# Patient Record
Sex: Male | Born: 1945 | Race: White | Hispanic: No | Marital: Married | Smoking: Former smoker
Health system: Southern US, Community
[De-identification: ages and names within clinical notes are randomized; demographics above are authoritative.]

## PROBLEM LIST (undated history)

## (undated) DIAGNOSIS — F32A Depression, unspecified: Secondary | ICD-10-CM

## (undated) DIAGNOSIS — F419 Anxiety disorder, unspecified: Secondary | ICD-10-CM

## (undated) DIAGNOSIS — I10 Essential (primary) hypertension: Secondary | ICD-10-CM

## (undated) HISTORY — DX: Essential (primary) hypertension: I10

## (undated) HISTORY — PX: HAND SURGERY: SHX662

---

## 2009-02-09 ENCOUNTER — Emergency Department (HOSPITAL_COMMUNITY): Admission: EM | Admit: 2009-02-09 | Discharge: 2009-02-09 | Payer: Self-pay | Admitting: *Deleted

## 2014-01-17 DIAGNOSIS — C4491 Basal cell carcinoma of skin, unspecified: Secondary | ICD-10-CM

## 2014-01-17 HISTORY — DX: Basal cell carcinoma of skin, unspecified: C44.91

## 2014-01-21 ENCOUNTER — Institutional Professional Consult (permissible substitution): Payer: Self-pay | Admitting: Pulmonary Disease

## 2014-02-04 ENCOUNTER — Institutional Professional Consult (permissible substitution): Payer: Self-pay | Admitting: Pulmonary Disease

## 2014-02-11 ENCOUNTER — Institutional Professional Consult (permissible substitution): Payer: Self-pay | Admitting: Internal Medicine

## 2015-07-07 ENCOUNTER — Other Ambulatory Visit: Payer: Self-pay | Admitting: Internal Medicine

## 2015-07-07 ENCOUNTER — Ambulatory Visit
Admission: RE | Admit: 2015-07-07 | Discharge: 2015-07-07 | Disposition: A | Discharge status: Home / Self Care | Payer: Medicare HMO | Source: Ambulatory Visit | Attending: Internal Medicine | Admitting: Internal Medicine

## 2015-07-07 DIAGNOSIS — R059 Cough, unspecified: Secondary | ICD-10-CM

## 2015-07-07 DIAGNOSIS — R05 Cough: Secondary | ICD-10-CM

## 2015-09-05 ENCOUNTER — Other Ambulatory Visit: Payer: Self-pay | Admitting: Internal Medicine

## 2015-09-05 DIAGNOSIS — R140 Abdominal distension (gaseous): Secondary | ICD-10-CM

## 2015-09-07 ENCOUNTER — Ambulatory Visit
Admission: RE | Admit: 2015-09-07 | Discharge: 2015-09-07 | Disposition: A | Discharge status: Home / Self Care | Payer: Medicare HMO | Source: Ambulatory Visit | Attending: Internal Medicine | Admitting: Internal Medicine

## 2015-09-07 DIAGNOSIS — R140 Abdominal distension (gaseous): Secondary | ICD-10-CM

## 2016-08-20 DIAGNOSIS — I10 Essential (primary) hypertension: Secondary | ICD-10-CM | POA: Insufficient documentation

## 2018-03-02 DIAGNOSIS — M720 Palmar fascial fibromatosis [Dupuytren]: Secondary | ICD-10-CM | POA: Insufficient documentation

## 2020-01-14 ENCOUNTER — Ambulatory Visit: Payer: Medicare HMO | Attending: Internal Medicine

## 2020-01-14 DIAGNOSIS — Z23 Encounter for immunization: Secondary | ICD-10-CM | POA: Insufficient documentation

## 2020-01-14 NOTE — Progress Notes (Signed)
   Covid-19 Vaccination Clinic  Name:  Eduardo Hall    MRN: [id number] DOB: [birth year]  01/14/2020  Eduardo Hall was observed post Covid-19 immunization for 15 minutes without incidence. He was provided with Vaccine Information Sheet and instruction to access the V-Safe system.   Eduardo Hall was instructed to call 911 with any severe reactions post vaccine: Marland Kitchen Difficulty breathing  . Swelling of your face and throat  . A fast heartbeat  . A bad rash all over your body  . Dizziness and weakness    Immunizations Administered    Name Date Dose VIS Date Route   Pfizer COVID-19 Vaccine 01/14/2020 12:17 PM 0.3 mL 11/19/2019 Intramuscular   Manufacturer: [place]   Lot: [id number]   [place]: [id number]

## 2020-01-31 ENCOUNTER — Ambulatory Visit: Payer: Medicare HMO

## 2020-02-05 ENCOUNTER — Ambulatory Visit: Payer: Medicare HMO | Attending: Internal Medicine

## 2020-02-05 DIAGNOSIS — Z23 Encounter for immunization: Secondary | ICD-10-CM | POA: Insufficient documentation

## 2020-02-08 ENCOUNTER — Ambulatory Visit: Payer: Medicare HMO

## 2020-02-25 ENCOUNTER — Encounter: Payer: Self-pay | Admitting: Physician Assistant

## 2020-02-25 ENCOUNTER — Other Ambulatory Visit: Payer: Self-pay

## 2020-02-25 ENCOUNTER — Ambulatory Visit: Payer: Medicare HMO | Admitting: Physician Assistant

## 2020-02-25 DIAGNOSIS — L219 Seborrheic dermatitis, unspecified: Secondary | ICD-10-CM | POA: Diagnosis not present

## 2020-02-25 DIAGNOSIS — D0461 Carcinoma in situ of skin of right upper limb, including shoulder: Secondary | ICD-10-CM

## 2020-02-25 DIAGNOSIS — L299 Pruritus, unspecified: Secondary | ICD-10-CM | POA: Diagnosis not present

## 2020-02-25 DIAGNOSIS — L570 Actinic keratosis: Secondary | ICD-10-CM

## 2020-02-25 DIAGNOSIS — C4492 Squamous cell carcinoma of skin, unspecified: Secondary | ICD-10-CM

## 2020-02-25 DIAGNOSIS — D492 Neoplasm of unspecified behavior of bone, soft tissue, and skin: Secondary | ICD-10-CM

## 2020-02-25 DIAGNOSIS — Z85828 Personal history of other malignant neoplasm of skin: Secondary | ICD-10-CM | POA: Diagnosis not present

## 2020-02-25 HISTORY — DX: Squamous cell carcinoma of skin, unspecified: C44.92

## 2020-02-25 NOTE — Patient Instructions (Signed)

## 2020-02-25 NOTE — Progress Notes (Signed)
   Follow up Visit  Subjective  Eduardo Hall is a 74 y.o. male who presents for the following: Follow-up (Patient, last seen in October 2020, is here to recheck his right anterior scalp and right bulb of nose.  Patient was supposed to have had MOHs done on 01/13/2020 with Dr. Winifred Olive. He mentioned that Dr. Winifred Olive decided to use 5FU for 3 weeks, instead of doing the [facility] procedure.  Has follow-up appointment with him in April.) and Dermatitis (He mentioned that he is still itching from waist up.  Stated that he has been using the Ciclopirox shampoo/cream for his seborrheic dermatitis and TAC and hydrocortisone for the general itching. Is having flaking on his face but it has improved.  Also, he has added an allergy tablet and Benadryl daily for the general itching.  Lab work done was negative.).  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 They are using all free and clear detergent and he is using Dove soap. He itches especially over the chest and there is never any rash there.   Objective  Well appearing patient in no apparent distress; mood and affect are within normal limits.  All skin waist up examined. No suspicious moles noted on back. No rash noted waist up other than the seb derm in his eyebrow and that has improved.    Right Shoulder - Posterior      Assessment & Plan  Actinic keratosis Left Temple  Destruction of lesion - Left Temple Complexity: simple   Destruction method: cryotherapy   Informed consent: discussed and consent obtained   Timeout:  patient name, date of birth, surgical site, and procedure verified Lesion destroyed using liquid nitrogen: Yes   Outcome: patient tolerated procedure well with no complications    Neoplasm of skin Right Shoulder - Posterior  Skin / nail biopsy Type of biopsy: tangential   Informed consent: discussed and consent obtained     Instrument used: flexible razor blade   Hemostasis achieved with: ferric subsulfate   Outcome: patient tolerated procedure well   Post-procedure details: wound care instructions given    Specimen 1 - Surgical pathology Differential Diagnosis: BCC vs SCC  Check Margins: No  Pruritis waist up We will request his most recent labs to view those and I will discuss case with Dr. Denna Haggard to see what our other options may be.

## 2020-02-25 NOTE — Progress Notes (Deleted)
   Follow-Up Visit   Subjective  Eduardo Hall is a 74 y.o. male who presents for the following: Follow-up (Patient, last seen in October 2020, is here to recheck his right anterior scalp and right bulb of nose.  Patient had MOHs done on 01/13/2020 with Dr. Winifred Olive. He mentioned that Dr. Winifred Olive decided to use 5FU for 3 weeks, instead of doing the [facility] procedure.  Has follow-up appointment with him in April.) and Dermatitis (He mentioned that he is still itching from waist up.  Stated that he has been using the Ciclopirox shampoo/cream, TAC and hydrocortisone.  Is having flaking on his face.  Also, he has added an allergy tablet and Benadryl daily.  Lab work done was negative.). Dermatitis He complains of a rash. Symptoms began {0-10:33138} {time units:11} ago. Patient describes the rash as {dermatitis:5764}. Characteristics of rash and associated history: {dermrash:16702}. His previous dermatologic history includes {derm diag:16511}. Family history of derm problems: {derm diag:16511}. Medications currently using: {derm meds:16509}. Environmental exposures or allergies: {allergens:16439}.    The following portions of the chart were reviewed this encounter and updated as appropriate:     {Review of Systems:34166::"No other skin or systemic complaints."}  Objective  Well appearing patient in no apparent distress; mood and affect are within normal limits.  [id number] full examination was performed including scalp, head, eyes, ears, nose, lips, neck, chest, axillae, abdomen, back, buttocks, bilateral upper extremities, bilateral lower extremities, hands, feet, fingers, toes, fingernails, and toenails. All findings within normal limits unless otherwise noted below."}  No skin findings found.  Assessment & Plan   No follow-ups on file.

## 2020-02-28 ENCOUNTER — Encounter: Payer: Self-pay | Admitting: Physician Assistant

## 2020-02-29 ENCOUNTER — Telehealth: Payer: Self-pay

## 2020-02-29 NOTE — Telephone Encounter (Signed)
-----   Message from Arlyss Gandy, [place] sent at 02/29/2020  9:03 AM EDT ----- 30 minute surgery with JCB

## 2020-02-29 NOTE — Telephone Encounter (Signed)
Left voice mail for patient to call.

## 2020-02-29 NOTE — Telephone Encounter (Signed)
Path to patient and 30 minute schedule with JCB on Apr 10, 2020.

## 2020-03-20 ENCOUNTER — Ambulatory Visit: Payer: Medicare HMO | Admitting: Physician Assistant

## 2020-04-10 ENCOUNTER — Encounter: Payer: Self-pay | Admitting: Physician Assistant

## 2020-04-10 ENCOUNTER — Ambulatory Visit (INDEPENDENT_AMBULATORY_CARE_PROVIDER_SITE_OTHER): Payer: Medicare HMO | Admitting: Physician Assistant

## 2020-04-10 ENCOUNTER — Other Ambulatory Visit: Payer: Self-pay

## 2020-04-10 DIAGNOSIS — D0461 Carcinoma in situ of skin of right upper limb, including shoulder: Secondary | ICD-10-CM

## 2020-04-10 DIAGNOSIS — D485 Neoplasm of uncertain behavior of skin: Secondary | ICD-10-CM

## 2020-04-10 DIAGNOSIS — Z85828 Personal history of other malignant neoplasm of skin: Secondary | ICD-10-CM | POA: Diagnosis not present

## 2020-04-10 DIAGNOSIS — L299 Pruritus, unspecified: Secondary | ICD-10-CM | POA: Diagnosis not present

## 2020-04-10 NOTE — Progress Notes (Addendum)
   Follow up Visit  Subjective  Eduardo Hall is a 74 y.o. 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 He also has a bump near his nose on the right side that gets irritated. He is also still having trouble with the itch on his upper body and neck.  Objective  Well appearing patient in no apparent distress; mood and affect are within normal limits.  All skin waist up examined not including scalp. No suspicious moles noted on back.   Objective  Right Shoulder - Posterior: Biopsy scar identified.   Objective  Mid Tip of Nose: Surgical scar examined no sign of recurrence. Was seen by [facility].  Objective  Right Nasofacial Sulcus: Pearly papule right nasal crease.     Objective  Neck - Anterior: Pruritis on upper body and neck.  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 Right Shoulder - Posterior  Destruction of lesion Complexity: simple   Destruction method: electrodesiccation and curettage   Informed consent: discussed and consent obtained   Timeout:  patient name, date of birth, surgical site, and procedure verified Anesthesia: the lesion was anesthetized in a standard fashion   Anesthetic:  1% lidocaine w/ epinephrine 1-100,000 local infiltration Curettage performed in three different directions: Yes   Curettage cycles:  3 Lesion length (cm):  2 Lesion width (cm):  1 Margin per side (cm):  0 Final wound size (cm):  2 Hemostasis achieved with:  ferric subsulfate Outcome: patient tolerated procedure well with no complications   Post-procedure details: sterile dressing applied and wound care instructions given   Dressing type: bandage and petrolatum   Additional details:  Wound innoculated with 5 fluorouracil solution. 2.0cm  History of basal cell carcinoma (BCC) Mid Tip of Nose  Neoplasm of uncertain behavior of skin Right Nasofacial Sulcus  Skin / nail biopsy Type of  biopsy: tangential   Informed consent: discussed and consent obtained   Procedure prep:  Patient was prepped and draped in usual sterile fashion (Non sterile) Prep type:  Chlorhexidine Anesthesia: the lesion was anesthetized in a standard fashion   Anesthetic:  1% lidocaine w/ epinephrine 1-100,000 local infiltration Instrument used: flexible razor blade   Hemostasis achieved with: ferric subsulfate   Outcome: patient tolerated procedure well   Post-procedure details: sterile dressing applied and wound care instructions given   Dressing type: bandage and petrolatum   Additional details:  Cautery only  Specimen 1 - Surgical pathology Differential Diagnosis: BCC Check Margins: No  Pruritus Neck - Anterior  Other Related Procedures Bile acids, total

## 2020-04-10 NOTE — Patient Instructions (Signed)

## 2020-04-15 LAB — BILE ACIDS, TOTAL: Bile Acids Total: 10 umol/L (ref 0–19)

## 2020-04-17 ENCOUNTER — Telehealth: Payer: Self-pay | Admitting: *Deleted

## 2020-04-17 NOTE — Telephone Encounter (Signed)
Path results and lab results to patient. Per Arlyss Gandy he can see Dr. Darene Lamer for another opinion to see if he can help, appointment made.

## 2020-04-17 NOTE — Telephone Encounter (Signed)
-----   Message from Arlyss Gandy, [place] sent at 04/17/2020  4:34 PM EDT ----- Bile acids are normal. Nothing in labs to account for pruritis. May be good for him to see ST for appt to see if he can help.

## 2020-05-17 ENCOUNTER — Ambulatory Visit: Payer: Medicare HMO | Admitting: Dermatology

## 2020-06-26 ENCOUNTER — Ambulatory Visit: Payer: Medicare HMO | Admitting: Dermatology

## 2020-06-26 ENCOUNTER — Other Ambulatory Visit: Payer: Self-pay

## 2020-06-26 DIAGNOSIS — L299 Pruritus, unspecified: Secondary | ICD-10-CM

## 2020-06-26 MED ORDER — HALOBETASOL PROPIONATE 0.05 % EX CREA: TOPICAL_CREAM | Freq: Two times a day (BID) | CUTANEOUS | 3 refills | Status: AC

## 2020-07-01 ENCOUNTER — Encounter: Payer: Self-pay | Admitting: Dermatology

## 2020-07-01 NOTE — Progress Notes (Signed)
° °  Follow-Up Visit   Subjective  Eduardo Hall is a 74 y.o. male who presents for the following: Pruritis (wants second opinion, patient has been itching for the last 2 years on his chest, last month its gotten better, also experiencing spots on neck, has had to wear gloves to bed to keep from scratching).  Itch, rash Location: Mostly torso Duration:  Quality:  Associated Signs/Symptoms: Modifying Factors:  Severity:  Timing: Context: Review of systems negative for systemic causes of itching.  Objective  Well appearing patient in no apparent distress; mood and affect are within normal limits.  All skin waist up examined.   Assessment & Plan    Pruritus (2) Chest - [facility]); Mid Back  30-day trial topical halobetasol to all areas prone to rash once daily after bathing.  Follow-up by MyChart or by telephone at that time  Ordered Medications: halobetasol (ULTRAVATE) 0.05 % cream     I, Lavonna Monarch, MD, have reviewed all documentation for this visit.  The documentation on 07/01/20 for the exam, diagnosis, procedures, and orders are all accurate and complete.

## 2020-07-10 ENCOUNTER — Ambulatory Visit: Payer: Medicare HMO | Admitting: Physician Assistant

## 2020-07-10 ENCOUNTER — Other Ambulatory Visit: Payer: Self-pay

## 2020-07-10 ENCOUNTER — Encounter: Payer: Self-pay | Admitting: Physician Assistant

## 2020-07-10 DIAGNOSIS — L219 Seborrheic dermatitis, unspecified: Secondary | ICD-10-CM

## 2020-07-10 DIAGNOSIS — Z86007 Personal history of in-situ neoplasm of skin: Secondary | ICD-10-CM | POA: Diagnosis not present

## 2020-07-10 DIAGNOSIS — D485 Neoplasm of uncertain behavior of skin: Secondary | ICD-10-CM | POA: Diagnosis not present

## 2020-07-10 MED ORDER — HYDROCORTISONE 2.5 % EX CREA: TOPICAL_CREAM | Freq: Two times a day (BID) | CUTANEOUS | 2 refills | Status: AC | PRN

## 2020-07-10 NOTE — Progress Notes (Signed)
   Follow up Visit  Subjective  Eduardo Hall is a 74 y.o. male who presents for the following: Squamous Cell Carcinoma (follow up from biopsy). He feels it is doing well. His itching is coming and going and he did not get the halobetasol because it was too expensive. He would like a refill on his hydrocortisone 2.5% for his seborrheic dermatitis.   Objective  Well appearing patient in no apparent distress; mood and affect are within normal limits.  A focused examination was performed including right ear, right shoulder, arms, face. Relevant physical exam findings are noted in the Assessment and Plan.   Objective  Right Shoulder - Posterior: Dyspigmented scar.   Objective  Left Ala Nasi, Right Alar Crease: Erythematous plaques with greasy scale.   Objective  Right Superior Helix: Crusted papule     Assessment & Plan  Personal history of in-situ neoplasm of skin Right Shoulder - Posterior  Seborrheic dermatitis (2) Right Alar Crease; Left Ala Nasi  Ordered Medications: hydrocortisone 2.5 % cream  Neoplasm of uncertain behavior of skin Right Superior Helix  Skin / nail biopsy Type of biopsy: tangential   Informed consent: discussed and consent obtained   Timeout: patient name, date of birth, surgical site, and procedure verified   Procedure prep:  Patient was prepped and draped in usual sterile fashion (Non sterile) Prep type:  Chlorhexidine Anesthesia: the lesion was anesthetized in a standard fashion   Anesthetic:  1% lidocaine w/ epinephrine 1-100,000 local infiltration Instrument used: flexible razor blade   Outcome: patient tolerated procedure well   Post-procedure details: wound care instructions given    Specimen 1 - Surgical pathology Differential Diagnosis: SCC vs BCC Check Margins: No

## 2020-07-12 ENCOUNTER — Ambulatory Visit
Admission: RE | Admit: 2020-07-12 | Discharge: 2020-07-12 | Disposition: A | Discharge status: Home / Self Care | Payer: Medicare HMO | Source: Ambulatory Visit | Attending: Internal Medicine | Admitting: Internal Medicine

## 2020-07-12 ENCOUNTER — Other Ambulatory Visit: Payer: Self-pay | Admitting: Internal Medicine

## 2020-07-12 DIAGNOSIS — R0602 Shortness of breath: Secondary | ICD-10-CM

## 2020-07-13 ENCOUNTER — Telehealth: Payer: Self-pay

## 2020-07-13 NOTE — Telephone Encounter (Signed)
-----   Message from Arlyss Gandy, [place] sent at 07/12/2020  9:45 PM EDT ----- Recheck in 2 months

## 2020-07-13 NOTE — Telephone Encounter (Signed)
Patient aware.

## 2020-08-07 ENCOUNTER — Ambulatory Visit: Payer: Medicare HMO | Admitting: Dermatology

## 2020-08-15 ENCOUNTER — Ambulatory Visit (INDEPENDENT_AMBULATORY_CARE_PROVIDER_SITE_OTHER): Payer: Medicare HMO | Admitting: Otolaryngology

## 2020-08-15 ENCOUNTER — Encounter (INDEPENDENT_AMBULATORY_CARE_PROVIDER_SITE_OTHER): Payer: Self-pay | Admitting: Otolaryngology

## 2020-08-15 ENCOUNTER — Other Ambulatory Visit: Payer: Self-pay

## 2020-08-15 VITALS — Temp 97.5°F

## 2020-08-15 DIAGNOSIS — H903 Sensorineural hearing loss, bilateral: Secondary | ICD-10-CM | POA: Diagnosis not present

## 2020-08-15 DIAGNOSIS — H9313 Tinnitus, bilateral: Secondary | ICD-10-CM | POA: Diagnosis not present

## 2020-08-15 NOTE — Progress Notes (Signed)
HPI: Eduardo Hall is a 74 y.o. male who presents for evaluation of a humming or ringing in his ears for the past 3 weeks.  He has had a previous history of loud noise exposure.  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  He first noticed the ringing in the ears when he was changed on his prescription blood pressure medication.  Denies any ear pain.  Denies any recent loud noise exposure.  Past Medical History:  Diagnosis Date   Basal cell carcinoma 01/17/2014   Right arm - excision with Dr. Mancel Bale   Basal cell carcinoma 10/17/2015   Right cheek - excision with Dr. Mancel Bale   Basal cell carcinoma 10/08/2019   Right bulb of nose - Dr. 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 Social History   Socioeconomic History   Marital status: Married    Spouse name: Not on file   Number of children: Not on file   Years of education: Not on file   Highest education level: Not on file  Occupational History   Not on file  Tobacco Use   Smoking status: Former Smoker   Smokeless tobacco: Never Used  Scientific laboratory technician Use: Never assessed  Substance and Sexual Activity   Alcohol use: Yes   Drug use: Never   Sexual activity: Not on file  Other Topics Concern   Not on file  Social History Narrative   Not on file   Social Determinants of Health   Financial Resource Strain:    Difficulty of Paying Living Expenses: Not on file  Food Insecurity:    Worried About [place] in the Last Year: Not on file   Ran Out of Food in the Last Year: Not on file  Transportation Needs:    Lack of Transportation (Medical): Not on file   Lack of Transportation (Non-Medical): Not on file  Physical Activity:    Days of Exercise per Week: Not on file   Minutes of Exercise per Session:  Not on file  Stress:    Feeling of Stress : Not on file  Social Connections:    Frequency of Communication with Friends and Family: Not on file   Frequency of Social Gatherings with Friends and Family: Not on file   Attends Religious Services: Not on file   Active Member of Clubs or Organizations: Not on file   Attends Archivist Meetings: Not on file   Marital Status: Not on file   No family history on file. No Known Allergies Prior to Admission medications   Medication Sig Start Date End Date Taking? Authorizing Provider  ALPRAZolam Duanne Moron) 1 MG tablet Take 1 mg by mouth at bedtime as needed. 03/30/20  Yes [provider]  amLODipine (NORVASC) 10 MG tablet Take 10 mg by mouth daily.   Yes [provider]  ciclopirox (LOPROX) 0.77 % cream Apply topically 2 (two) times daily.   Yes [provider]  Ciclopirox 1 % shampoo Apply 120 mLs topically at bedtime.   Yes [provider]  fluorouracil (EFUDEX) 5 % cream Apply 40 g topically at bedtime. Apply QHS for one week, take 3 weeks off and repeat.  Avoid sun and expect irritation.   Yes Clark-Bruning, Jennifer, PA-C  halobetasol (ULTRAVATE) 0.05 %  cream Apply topically 2 (two) times daily. 06/26/20  Yes Lavonna Monarch, MD  hydrochlorothiazide (HYDRODIURIL) 12.5 MG tablet Take by mouth.   Yes [provider]  hydrocortisone 2.5 % cream Apply 1 application topically 2 (two) times daily. 10/02/18  Yes [provider]  hydrocortisone 2.5 % cream Apply topically 2 (two) times daily as needed (Rash). 07/10/20  Yes Clark-Bruning, Anderson [place], PA-C  lisinopril (ZESTRIL) 5 MG tablet Take 5 mg by mouth daily.   Yes [provider]  sildenafil (VIAGRA) 100 MG tablet Viagra 100 mg tablet   Yes [provider]  simvastatin (ZOCOR) 40 MG tablet Take 40 mg by mouth daily.   Yes [provider]  tetrahydrozoline 0.05 % ophthalmic solution eye drops redness reliever    Yes [provider]  triamcinolone ointment (KENALOG) 0.1 % Apply 1 application topically 2 (two) times daily.   Yes [provider]     Positive ROS: Otherwise negative  All other systems have been reviewed and were otherwise negative with the exception of those mentioned in the HPI and as above.  Physical Exam: Constitutional: Alert, well-appearing, no acute distress Ears: External ears without lesions or tenderness. Ear canals are clear bilaterally with intact, clear TMs.  Auscultation of the ears revealed no objective tinnitus or sounds. Nasal: External nose without lesions. Septum with mild deformity and mild rhinitis.  But no signs of infection.. Clear nasal passages Oral: Lips and gums without lesions. Tongue and palate mucosa without lesions. Posterior oropharynx clear. Neck: No palpable adenopathy or masses.  Auscultation of the neck reveals no carotid bruits. Respiratory: Breathing comfortably  Skin: No facial/neck lesions or rash noted.  Audiogram in the office today demonstrated a mild bilateral symmetric sensorineural hearing loss with severe downsloping SNHL in both ears above 4000 frequency.  He had type A tympanograms bilaterally.  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  Plan: Reviewed with him concerning treatment options which are limited.  Discussed with him concerning using masking noise to help cope with the tinnitus.  He would also be a candidate for hearing aids. Gave him samples of Lipo flavonoid to try as this is beneficial in some people. Also recommended using ear protection when around loud noise. He will follow-up as needed.  Radene Journey, MD

## 2020-08-17 ENCOUNTER — Encounter (INDEPENDENT_AMBULATORY_CARE_PROVIDER_SITE_OTHER): Payer: Self-pay

## 2020-09-06 ENCOUNTER — Other Ambulatory Visit: Payer: Self-pay | Admitting: Internal Medicine

## 2020-09-06 DIAGNOSIS — R42 Dizziness and giddiness: Secondary | ICD-10-CM

## 2020-09-06 DIAGNOSIS — R519 Headache, unspecified: Secondary | ICD-10-CM

## 2020-09-06 DIAGNOSIS — H9319 Tinnitus, unspecified ear: Secondary | ICD-10-CM

## 2020-09-18 ENCOUNTER — Ambulatory Visit
Admission: RE | Admit: 2020-09-18 | Discharge: 2020-09-18 | Disposition: A | Discharge status: Home / Self Care | Payer: Medicare HMO | Source: Ambulatory Visit | Attending: Internal Medicine | Admitting: Internal Medicine

## 2020-09-18 DIAGNOSIS — H9319 Tinnitus, unspecified ear: Secondary | ICD-10-CM

## 2020-09-18 DIAGNOSIS — R42 Dizziness and giddiness: Secondary | ICD-10-CM

## 2020-09-18 DIAGNOSIS — R519 Headache, unspecified: Secondary | ICD-10-CM

## 2021-01-16 DIAGNOSIS — I10 Essential (primary) hypertension: Secondary | ICD-10-CM | POA: Diagnosis not present

## 2021-02-13 ENCOUNTER — Other Ambulatory Visit: Payer: Self-pay | Admitting: Specialist

## 2021-02-13 DIAGNOSIS — I10 Essential (primary) hypertension: Secondary | ICD-10-CM | POA: Diagnosis not present

## 2021-02-13 DIAGNOSIS — G4452 New daily persistent headache (NDPH): Secondary | ICD-10-CM | POA: Diagnosis not present

## 2021-02-13 DIAGNOSIS — Z79899 Other long term (current) drug therapy: Secondary | ICD-10-CM | POA: Diagnosis not present

## 2021-02-13 DIAGNOSIS — Z049 Encounter for examination and observation for unspecified reason: Secondary | ICD-10-CM | POA: Diagnosis not present

## 2021-02-27 DIAGNOSIS — M542 Cervicalgia: Secondary | ICD-10-CM | POA: Diagnosis not present

## 2021-02-27 DIAGNOSIS — G4452 New daily persistent headache (NDPH): Secondary | ICD-10-CM | POA: Diagnosis not present

## 2021-02-27 DIAGNOSIS — M791 Myalgia, unspecified site: Secondary | ICD-10-CM | POA: Diagnosis not present

## 2021-02-27 DIAGNOSIS — G518 Other disorders of facial nerve: Secondary | ICD-10-CM | POA: Diagnosis not present

## 2021-03-03 ENCOUNTER — Ambulatory Visit
Admission: RE | Admit: 2021-03-03 | Discharge: 2021-03-03 | Disposition: A | Discharge status: Home / Self Care | Payer: Medicare HMO | Source: Ambulatory Visit | Attending: Specialist | Admitting: Specialist

## 2021-03-03 ENCOUNTER — Other Ambulatory Visit: Payer: Self-pay

## 2021-03-03 DIAGNOSIS — G4452 New daily persistent headache (NDPH): Secondary | ICD-10-CM

## 2021-03-03 DIAGNOSIS — R519 Headache, unspecified: Secondary | ICD-10-CM | POA: Diagnosis not present

## 2021-03-03 MED ORDER — GADOBENATE DIMEGLUMINE 529 MG/ML IV SOLN
15.0000 mL | Freq: Once | INTRAVENOUS | Status: AC | PRN
  Administered 2021-03-03: 15 mL via INTRAVENOUS

## 2021-03-06 ENCOUNTER — Other Ambulatory Visit: Payer: Medicare HMO

## 2021-03-13 DIAGNOSIS — M542 Cervicalgia: Secondary | ICD-10-CM | POA: Diagnosis not present

## 2021-03-13 DIAGNOSIS — M791 Myalgia, unspecified site: Secondary | ICD-10-CM | POA: Diagnosis not present

## 2021-03-13 DIAGNOSIS — G518 Other disorders of facial nerve: Secondary | ICD-10-CM | POA: Diagnosis not present

## 2021-03-13 DIAGNOSIS — G4452 New daily persistent headache (NDPH): Secondary | ICD-10-CM | POA: Diagnosis not present

## 2021-03-27 DIAGNOSIS — M542 Cervicalgia: Secondary | ICD-10-CM | POA: Diagnosis not present

## 2021-03-27 DIAGNOSIS — G4452 New daily persistent headache (NDPH): Secondary | ICD-10-CM | POA: Diagnosis not present

## 2021-03-27 DIAGNOSIS — M791 Myalgia, unspecified site: Secondary | ICD-10-CM | POA: Diagnosis not present

## 2021-03-27 DIAGNOSIS — G518 Other disorders of facial nerve: Secondary | ICD-10-CM | POA: Diagnosis not present

## 2021-04-11 DIAGNOSIS — M791 Myalgia, unspecified site: Secondary | ICD-10-CM | POA: Diagnosis not present

## 2021-04-11 DIAGNOSIS — G518 Other disorders of facial nerve: Secondary | ICD-10-CM | POA: Diagnosis not present

## 2021-04-11 DIAGNOSIS — G4452 New daily persistent headache (NDPH): Secondary | ICD-10-CM | POA: Diagnosis not present

## 2021-04-11 DIAGNOSIS — M542 Cervicalgia: Secondary | ICD-10-CM | POA: Diagnosis not present

## 2021-04-24 DIAGNOSIS — M542 Cervicalgia: Secondary | ICD-10-CM | POA: Diagnosis not present

## 2021-04-24 DIAGNOSIS — G4452 New daily persistent headache (NDPH): Secondary | ICD-10-CM | POA: Diagnosis not present

## 2021-04-24 DIAGNOSIS — G518 Other disorders of facial nerve: Secondary | ICD-10-CM | POA: Diagnosis not present

## 2021-04-24 DIAGNOSIS — M791 Myalgia, unspecified site: Secondary | ICD-10-CM | POA: Diagnosis not present

## 2021-05-22 DIAGNOSIS — G4452 New daily persistent headache (NDPH): Secondary | ICD-10-CM | POA: Diagnosis not present

## 2021-05-22 DIAGNOSIS — M791 Myalgia, unspecified site: Secondary | ICD-10-CM | POA: Diagnosis not present

## 2021-05-22 DIAGNOSIS — M542 Cervicalgia: Secondary | ICD-10-CM | POA: Diagnosis not present

## 2021-05-22 DIAGNOSIS — G518 Other disorders of facial nerve: Secondary | ICD-10-CM | POA: Diagnosis not present

## 2021-06-06 DIAGNOSIS — I10 Essential (primary) hypertension: Secondary | ICD-10-CM | POA: Diagnosis not present

## 2021-07-03 DIAGNOSIS — M791 Myalgia, unspecified site: Secondary | ICD-10-CM | POA: Diagnosis not present

## 2021-07-03 DIAGNOSIS — M542 Cervicalgia: Secondary | ICD-10-CM | POA: Diagnosis not present

## 2021-07-03 DIAGNOSIS — G518 Other disorders of facial nerve: Secondary | ICD-10-CM | POA: Diagnosis not present

## 2021-07-03 DIAGNOSIS — G4452 New daily persistent headache (NDPH): Secondary | ICD-10-CM | POA: Diagnosis not present

## 2021-07-16 DIAGNOSIS — E785 Hyperlipidemia, unspecified: Secondary | ICD-10-CM | POA: Diagnosis not present

## 2021-07-16 DIAGNOSIS — R1032 Left lower quadrant pain: Secondary | ICD-10-CM | POA: Diagnosis not present

## 2021-07-16 DIAGNOSIS — I70219 Atherosclerosis of native arteries of extremities with intermittent claudication, unspecified extremity: Secondary | ICD-10-CM | POA: Diagnosis not present

## 2021-07-16 DIAGNOSIS — Z1389 Encounter for screening for other disorder: Secondary | ICD-10-CM | POA: Diagnosis not present

## 2021-07-16 DIAGNOSIS — R42 Dizziness and giddiness: Secondary | ICD-10-CM | POA: Diagnosis not present

## 2021-07-16 DIAGNOSIS — H9113 Presbycusis, bilateral: Secondary | ICD-10-CM | POA: Diagnosis not present

## 2021-07-16 DIAGNOSIS — Z1331 Encounter for screening for depression: Secondary | ICD-10-CM | POA: Diagnosis not present

## 2021-07-16 DIAGNOSIS — E039 Hypothyroidism, unspecified: Secondary | ICD-10-CM | POA: Diagnosis not present

## 2021-07-16 DIAGNOSIS — Z125 Encounter for screening for malignant neoplasm of prostate: Secondary | ICD-10-CM | POA: Diagnosis not present

## 2021-07-16 DIAGNOSIS — D485 Neoplasm of uncertain behavior of skin: Secondary | ICD-10-CM | POA: Diagnosis not present

## 2021-07-16 DIAGNOSIS — Z0000 Encounter for general adult medical examination without abnormal findings: Secondary | ICD-10-CM | POA: Diagnosis not present

## 2021-07-16 DIAGNOSIS — R5383 Other fatigue: Secondary | ICD-10-CM | POA: Diagnosis not present

## 2021-07-16 DIAGNOSIS — Z79899 Other long term (current) drug therapy: Secondary | ICD-10-CM | POA: Diagnosis not present

## 2021-07-16 DIAGNOSIS — Z131 Encounter for screening for diabetes mellitus: Secondary | ICD-10-CM | POA: Diagnosis not present

## 2021-07-16 DIAGNOSIS — R002 Palpitations: Secondary | ICD-10-CM | POA: Diagnosis not present

## 2021-07-16 DIAGNOSIS — I10 Essential (primary) hypertension: Secondary | ICD-10-CM | POA: Diagnosis not present

## 2021-07-16 DIAGNOSIS — Z7289 Other problems related to lifestyle: Secondary | ICD-10-CM | POA: Diagnosis not present

## 2021-07-16 DIAGNOSIS — E7800 Pure hypercholesterolemia, unspecified: Secondary | ICD-10-CM | POA: Diagnosis not present

## 2021-07-31 DIAGNOSIS — M542 Cervicalgia: Secondary | ICD-10-CM | POA: Diagnosis not present

## 2021-07-31 DIAGNOSIS — G518 Other disorders of facial nerve: Secondary | ICD-10-CM | POA: Diagnosis not present

## 2021-07-31 DIAGNOSIS — G4452 New daily persistent headache (NDPH): Secondary | ICD-10-CM | POA: Diagnosis not present

## 2021-07-31 DIAGNOSIS — M791 Myalgia, unspecified site: Secondary | ICD-10-CM | POA: Diagnosis not present

## 2021-08-28 DIAGNOSIS — H04123 Dry eye syndrome of bilateral lacrimal glands: Secondary | ICD-10-CM | POA: Diagnosis not present

## 2021-08-28 DIAGNOSIS — H5212 Myopia, left eye: Secondary | ICD-10-CM | POA: Diagnosis not present

## 2021-08-28 DIAGNOSIS — H26493 Other secondary cataract, bilateral: Secondary | ICD-10-CM | POA: Diagnosis not present

## 2021-08-28 DIAGNOSIS — H353132 Nonexudative age-related macular degeneration, bilateral, intermediate dry stage: Secondary | ICD-10-CM | POA: Diagnosis not present

## 2021-09-11 DIAGNOSIS — G518 Other disorders of facial nerve: Secondary | ICD-10-CM | POA: Diagnosis not present

## 2021-09-11 DIAGNOSIS — M542 Cervicalgia: Secondary | ICD-10-CM | POA: Diagnosis not present

## 2021-09-11 DIAGNOSIS — M791 Myalgia, unspecified site: Secondary | ICD-10-CM | POA: Diagnosis not present

## 2021-09-11 DIAGNOSIS — G4452 New daily persistent headache (NDPH): Secondary | ICD-10-CM | POA: Diagnosis not present

## 2021-09-28 DIAGNOSIS — M4726 Other spondylosis with radiculopathy, lumbar region: Secondary | ICD-10-CM | POA: Diagnosis not present

## 2021-09-28 DIAGNOSIS — M9903 Segmental and somatic dysfunction of lumbar region: Secondary | ICD-10-CM | POA: Diagnosis not present

## 2021-09-28 DIAGNOSIS — M5451 Vertebrogenic low back pain: Secondary | ICD-10-CM | POA: Diagnosis not present

## 2021-09-28 DIAGNOSIS — M5440 Lumbago with sciatica, unspecified side: Secondary | ICD-10-CM | POA: Diagnosis not present

## 2021-10-03 DIAGNOSIS — M5440 Lumbago with sciatica, unspecified side: Secondary | ICD-10-CM | POA: Diagnosis not present

## 2021-10-03 DIAGNOSIS — M4726 Other spondylosis with radiculopathy, lumbar region: Secondary | ICD-10-CM | POA: Diagnosis not present

## 2021-10-03 DIAGNOSIS — M5451 Vertebrogenic low back pain: Secondary | ICD-10-CM | POA: Diagnosis not present

## 2021-10-03 DIAGNOSIS — M9903 Segmental and somatic dysfunction of lumbar region: Secondary | ICD-10-CM | POA: Diagnosis not present

## 2021-10-04 DIAGNOSIS — M4726 Other spondylosis with radiculopathy, lumbar region: Secondary | ICD-10-CM | POA: Diagnosis not present

## 2021-10-04 DIAGNOSIS — M5440 Lumbago with sciatica, unspecified side: Secondary | ICD-10-CM | POA: Diagnosis not present

## 2021-10-04 DIAGNOSIS — M9903 Segmental and somatic dysfunction of lumbar region: Secondary | ICD-10-CM | POA: Diagnosis not present

## 2021-10-04 DIAGNOSIS — M5451 Vertebrogenic low back pain: Secondary | ICD-10-CM | POA: Diagnosis not present

## 2021-10-05 DIAGNOSIS — M5451 Vertebrogenic low back pain: Secondary | ICD-10-CM | POA: Diagnosis not present

## 2021-10-05 DIAGNOSIS — M4726 Other spondylosis with radiculopathy, lumbar region: Secondary | ICD-10-CM | POA: Diagnosis not present

## 2021-10-05 DIAGNOSIS — M9903 Segmental and somatic dysfunction of lumbar region: Secondary | ICD-10-CM | POA: Diagnosis not present

## 2021-10-05 DIAGNOSIS — M5440 Lumbago with sciatica, unspecified side: Secondary | ICD-10-CM | POA: Diagnosis not present

## 2021-10-10 DIAGNOSIS — M5440 Lumbago with sciatica, unspecified side: Secondary | ICD-10-CM | POA: Diagnosis not present

## 2021-10-10 DIAGNOSIS — I10 Essential (primary) hypertension: Secondary | ICD-10-CM | POA: Diagnosis not present

## 2021-10-10 DIAGNOSIS — M4726 Other spondylosis with radiculopathy, lumbar region: Secondary | ICD-10-CM | POA: Diagnosis not present

## 2021-10-10 DIAGNOSIS — M5451 Vertebrogenic low back pain: Secondary | ICD-10-CM | POA: Diagnosis not present

## 2021-10-10 DIAGNOSIS — J209 Acute bronchitis, unspecified: Secondary | ICD-10-CM | POA: Diagnosis not present

## 2021-10-10 DIAGNOSIS — M9903 Segmental and somatic dysfunction of lumbar region: Secondary | ICD-10-CM | POA: Diagnosis not present

## 2021-10-11 ENCOUNTER — Other Ambulatory Visit: Payer: Self-pay | Admitting: Internal Medicine

## 2021-10-11 ENCOUNTER — Other Ambulatory Visit: Payer: Self-pay

## 2021-10-11 ENCOUNTER — Ambulatory Visit
Admission: RE | Admit: 2021-10-11 | Discharge: 2021-10-11 | Disposition: A | Discharge status: Home / Self Care | Payer: Medicare HMO | Source: Ambulatory Visit | Attending: Internal Medicine | Admitting: Internal Medicine

## 2021-10-11 DIAGNOSIS — R0602 Shortness of breath: Secondary | ICD-10-CM

## 2021-10-11 DIAGNOSIS — M5451 Vertebrogenic low back pain: Secondary | ICD-10-CM | POA: Diagnosis not present

## 2021-10-11 DIAGNOSIS — I700 Atherosclerosis of aorta: Secondary | ICD-10-CM | POA: Diagnosis not present

## 2021-10-11 DIAGNOSIS — M5440 Lumbago with sciatica, unspecified side: Secondary | ICD-10-CM | POA: Diagnosis not present

## 2021-10-11 DIAGNOSIS — M9903 Segmental and somatic dysfunction of lumbar region: Secondary | ICD-10-CM | POA: Diagnosis not present

## 2021-10-11 DIAGNOSIS — J439 Emphysema, unspecified: Secondary | ICD-10-CM | POA: Diagnosis not present

## 2021-10-11 DIAGNOSIS — M4726 Other spondylosis with radiculopathy, lumbar region: Secondary | ICD-10-CM | POA: Diagnosis not present

## 2021-10-12 DIAGNOSIS — M5440 Lumbago with sciatica, unspecified side: Secondary | ICD-10-CM | POA: Diagnosis not present

## 2021-10-12 DIAGNOSIS — M4726 Other spondylosis with radiculopathy, lumbar region: Secondary | ICD-10-CM | POA: Diagnosis not present

## 2021-10-12 DIAGNOSIS — M9903 Segmental and somatic dysfunction of lumbar region: Secondary | ICD-10-CM | POA: Diagnosis not present

## 2021-10-12 DIAGNOSIS — M5451 Vertebrogenic low back pain: Secondary | ICD-10-CM | POA: Diagnosis not present

## 2021-10-16 DIAGNOSIS — L989 Disorder of the skin and subcutaneous tissue, unspecified: Secondary | ICD-10-CM | POA: Diagnosis not present

## 2021-10-16 DIAGNOSIS — C44719 Basal cell carcinoma of skin of left lower limb, including hip: Secondary | ICD-10-CM | POA: Diagnosis not present

## 2021-10-16 DIAGNOSIS — L218 Other seborrheic dermatitis: Secondary | ICD-10-CM | POA: Diagnosis not present

## 2021-10-16 DIAGNOSIS — C44712 Basal cell carcinoma of skin of right lower limb, including hip: Secondary | ICD-10-CM | POA: Diagnosis not present

## 2021-10-23 DIAGNOSIS — G4452 New daily persistent headache (NDPH): Secondary | ICD-10-CM | POA: Diagnosis not present

## 2021-11-06 DIAGNOSIS — J209 Acute bronchitis, unspecified: Secondary | ICD-10-CM | POA: Diagnosis not present

## 2021-11-14 DIAGNOSIS — C44712 Basal cell carcinoma of skin of right lower limb, including hip: Secondary | ICD-10-CM | POA: Diagnosis not present

## 2021-11-14 DIAGNOSIS — I872 Venous insufficiency (chronic) (peripheral): Secondary | ICD-10-CM | POA: Diagnosis not present

## 2021-12-18 DIAGNOSIS — G4452 New daily persistent headache (NDPH): Secondary | ICD-10-CM | POA: Diagnosis not present

## 2022-01-08 DIAGNOSIS — L298 Other pruritus: Secondary | ICD-10-CM | POA: Diagnosis not present

## 2022-01-15 DIAGNOSIS — R42 Dizziness and giddiness: Secondary | ICD-10-CM | POA: Diagnosis not present

## 2022-01-15 DIAGNOSIS — D485 Neoplasm of uncertain behavior of skin: Secondary | ICD-10-CM | POA: Diagnosis not present

## 2022-02-19 DIAGNOSIS — G4452 New daily persistent headache (NDPH): Secondary | ICD-10-CM | POA: Diagnosis not present

## 2022-03-12 DIAGNOSIS — R42 Dizziness and giddiness: Secondary | ICD-10-CM | POA: Diagnosis not present

## 2022-04-02 DIAGNOSIS — R5383 Other fatigue: Secondary | ICD-10-CM | POA: Diagnosis not present

## 2022-04-02 DIAGNOSIS — R42 Dizziness and giddiness: Secondary | ICD-10-CM | POA: Diagnosis not present

## 2022-04-02 DIAGNOSIS — I480 Paroxysmal atrial fibrillation: Secondary | ICD-10-CM | POA: Diagnosis not present

## 2022-04-03 DIAGNOSIS — R1032 Left lower quadrant pain: Secondary | ICD-10-CM | POA: Diagnosis not present

## 2022-04-30 DIAGNOSIS — G518 Other disorders of facial nerve: Secondary | ICD-10-CM | POA: Diagnosis not present

## 2022-04-30 DIAGNOSIS — M791 Myalgia, unspecified site: Secondary | ICD-10-CM | POA: Diagnosis not present

## 2022-04-30 DIAGNOSIS — G4452 New daily persistent headache (NDPH): Secondary | ICD-10-CM | POA: Diagnosis not present

## 2022-04-30 DIAGNOSIS — M542 Cervicalgia: Secondary | ICD-10-CM | POA: Diagnosis not present

## 2022-05-15 DIAGNOSIS — R42 Dizziness and giddiness: Secondary | ICD-10-CM | POA: Diagnosis not present

## 2022-05-16 ENCOUNTER — Telehealth: Payer: Self-pay | Admitting: Physician Assistant

## 2022-05-16 NOTE — Telephone Encounter (Signed)
Scheduled appt per 6/7 referral. Pt is aware of appt date and time. Pt is aware to arrive 15 mins prior to appt time and to bring and updated insurance card. Pt is aware of appt location.

## 2022-05-21 DIAGNOSIS — G4452 New daily persistent headache (NDPH): Secondary | ICD-10-CM | POA: Diagnosis not present

## 2022-06-04 ENCOUNTER — Inpatient Hospital Stay: Payer: Medicare HMO | Attending: Physician Assistant | Admitting: Physician Assistant

## 2022-06-04 ENCOUNTER — Other Ambulatory Visit: Payer: Self-pay

## 2022-06-04 ENCOUNTER — Inpatient Hospital Stay: Payer: Medicare HMO

## 2022-06-04 ENCOUNTER — Encounter: Payer: Self-pay | Admitting: Physician Assistant

## 2022-06-04 VITALS — BP 115/93 | HR 71 | Temp 97.9°F | Resp 20 | Wt 190.8 lb

## 2022-06-04 DIAGNOSIS — Z85828 Personal history of other malignant neoplasm of skin: Secondary | ICD-10-CM | POA: Diagnosis not present

## 2022-06-04 DIAGNOSIS — Z87891 Personal history of nicotine dependence: Secondary | ICD-10-CM | POA: Diagnosis not present

## 2022-06-04 DIAGNOSIS — I10 Essential (primary) hypertension: Secondary | ICD-10-CM | POA: Diagnosis not present

## 2022-06-04 DIAGNOSIS — R1032 Left lower quadrant pain: Secondary | ICD-10-CM | POA: Diagnosis not present

## 2022-06-04 DIAGNOSIS — D696 Thrombocytopenia, unspecified: Secondary | ICD-10-CM

## 2022-06-04 DIAGNOSIS — Z801 Family history of malignant neoplasm of trachea, bronchus and lung: Secondary | ICD-10-CM

## 2022-06-04 DIAGNOSIS — R5383 Other fatigue: Secondary | ICD-10-CM

## 2022-06-04 LAB — CBC WITH DIFFERENTIAL (CANCER CENTER ONLY)
Abs Immature Granulocytes: 0.04 10*3/uL (ref 0.00–0.07)
Basophils Absolute: 0 10*3/uL (ref 0.0–0.1)
Basophils Relative: 0 %
Eosinophils Absolute: 0.1 10*3/uL (ref 0.0–0.5)
Eosinophils Relative: 1 %
HCT: 43.7 % (ref 39.0–52.0)
Hemoglobin: 15.6 g/dL (ref 13.0–17.0)
Immature Granulocytes: 1 %
Lymphocytes Relative: 19 %
Lymphs Abs: 1.5 10*3/uL (ref 0.7–4.0)
MCH: 32.8 pg (ref 26.0–34.0)
MCHC: 35.7 g/dL (ref 30.0–36.0)
MCV: 92 fL (ref 80.0–100.0)
Monocytes Absolute: 0.4 10*3/uL (ref 0.1–1.0)
Monocytes Relative: 5 %
Neutro Abs: 5.7 10*3/uL (ref 1.7–7.7)
Neutrophils Relative %: 74 %
Platelet Count: 225 10*3/uL (ref 150–400)
RBC: 4.75 MIL/uL (ref 4.22–5.81)
RDW: 13 % (ref 11.5–15.5)
WBC Count: 7.7 10*3/uL (ref 4.0–10.5)
nRBC: 0 % (ref 0.0–0.2)

## 2022-06-04 LAB — HIV ANTIBODY (ROUTINE TESTING W REFLEX): HIV Screen 4th Generation wRfx: NONREACTIVE

## 2022-06-04 LAB — CMP (CANCER CENTER ONLY)
ALT: 62 U/L — ABNORMAL HIGH (ref 0–44)
AST: 39 U/L (ref 15–41)
Albumin: 4.9 g/dL (ref 3.5–5.0)
Alkaline Phosphatase: 62 U/L (ref 38–126)
Anion gap: 9 (ref 5–15)
BUN: 10 mg/dL (ref 8–23)
CO2: 26 mmol/L (ref 22–32)
Calcium: 9.7 mg/dL (ref 8.9–10.3)
Chloride: 96 mmol/L — ABNORMAL LOW (ref 98–111)
Creatinine: 0.97 mg/dL (ref 0.61–1.24)
GFR, Estimated: >60 mL/min (ref >60)
Glucose, Bld: 99 mg/dL (ref 70–99)
Potassium: 4.5 mmol/L (ref 3.5–5.1)
Sodium: 131 mmol/L — ABNORMAL LOW (ref 135–145)
Total Bilirubin: 0.6 mg/dL (ref 0.3–1.2)
Total Protein: 8 g/dL (ref 6.5–8.1)

## 2022-06-04 LAB — PROTIME-INR
INR: 0.9 (ref 0.8–1.2)
Prothrombin Time: 12.3 seconds (ref 11.4–15.2)

## 2022-06-04 LAB — HEPATITIS B CORE ANTIBODY, TOTAL: Hep B Core Total Ab: NONREACTIVE

## 2022-06-04 LAB — FOLATE: Folate: 9.2 ng/mL (ref >5.9)

## 2022-06-04 LAB — HEPATITIS B SURFACE ANTIGEN: Hepatitis B Surface Ag: NONREACTIVE

## 2022-06-04 LAB — SAVE SMEAR(SSMR), FOR PROVIDER SLIDE REVIEW

## 2022-06-04 LAB — HEPATITIS B SURFACE ANTIBODY,QUALITATIVE: Hep B S Ab: NONREACTIVE

## 2022-06-04 LAB — HEPATITIS C ANTIBODY: HCV Ab: NONREACTIVE

## 2022-06-04 LAB — IMMATURE PLATELET FRACTION: Immature Platelet Fraction: 2.7 % (ref 1.2–8.6)

## 2022-06-04 LAB — VITAMIN B12: Vitamin B-12: 336 pg/mL (ref 180–914)

## 2022-06-04 LAB — APTT: aPTT: 31 seconds (ref 24–36)

## 2022-06-04 NOTE — Progress Notes (Signed)
[place] [phone]   [phone] [facility] NOTE  Patient Care Team: Lorene Dy, MD as PCP - General (Internal Medicine) Janne Napoleon, PA-C (Inactive) (Dermatology) Lavonna Monarch, MD as Consulting Physician (Dermatology)  Hematological/Oncological History 1) 04/02/2022: Labs from PCP, Dr. Lorene Dy: -WBC 6/9, Hgb 14.5, Plt 87K  2) 06/03/2022: Establish care with [facility] Hematology with Dede Query PA-C and Dr. Narda Rutherford  CHIEF COMPLAINTS/PURPOSE OF CONSULTATION:  Thrombocytopenia  HISTORY OF PRESENTING ILLNESS:  [facility] 76 y.o. 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 He presents to the clinic for initial evaluation for thrombocytopenia. He is unaccompanied for this visit.   On exam today, [facility] report having progressive fatigue that has interfered with his ability to work. He has difficulty concentrating but is able to complete his daily routines. He denies any appetite changes or weight loss. He denies nausea, vomiting or abdominal pain. He has occasional episodes of constipation. He has noticed more bruising in his arms and upper chest. He denies any injuries that cause the bruising. He denies any active bleeding. He reports diffuse itchiness that has been present for over 2 years. He denies any rash that associated with the itchiness. He reports trying various creams which has not improved his symptoms.  Patient denies fevers, chills, night sweats, shortness of breath, chest pain or cough. He has has no other complaints. Rest of the 10 point ROS is below.   MEDICAL HISTORY:  Past Medical History:  Diagnosis Date   Basal cell carcinoma 01/17/2014   Right arm - excision with Dr. Mancel Bale   Basal cell carcinoma 10/17/2015   Right cheek - excision with Dr. Mancel Bale   Basal cell carcinoma 10/08/2019   Right bulb of  nose - Dr. Winifred Olive   Hypertension    Squamous cell carcinoma of skin 02/25/2020   in situ on right posterior shoulder - CX3+5FU    SURGICAL HISTORY: Past Surgical History:  Procedure Laterality Date   HAND SURGERY      SOCIAL HISTORY: Social History   Socioeconomic History   Marital status: Married    Spouse name: Not on file   Number of children: Not on file   Years of education: Not on file   Highest education level: Not on file  Occupational History   Not on file  Tobacco Use   Smoking status: Former    Packs/day: 3.00    Years: 40.00    Total pack years: 120.00    Types: Cigarettes    Quit date: 11    Years since quitting: 30.5   Smokeless tobacco: Never  Vaping Use   Vaping Use: Not on file  Substance and Sexual Activity   Alcohol use: Yes    Comment: 2-3 glasses of wine per night   Drug use: Never   Sexual activity: Not on file  Other Topics Concern   Not on file  Social History Narrative   Not on file   Social Determinants of Health   Financial Resource Strain: Not on file  Food Insecurity: Not on file  Transportation Needs: Not on file  Physical Activity: Not on file  Stress: Not on file  Social Connections: Not on file  Intimate Partner Violence: Not on file    FAMILY HISTORY: Family History  Problem Relation Age of Onset   Heart attack Father    Heart attack Sister  Cancer - Lung Brother    Heart attack Brother     ALLERGIES:  has No Known Allergies.  MEDICATIONS:  Current Outpatient Medications  Medication Sig Dispense Refill   ALPRAZolam (XANAX) 1 MG tablet Take 1 mg by mouth at bedtime as needed.     amLODipine (NORVASC) 10 MG tablet Take 10 mg by mouth daily.     ciclopirox (LOPROX) 0.77 % cream Apply topically 2 (two) times daily.     Ciclopirox 1 % shampoo Apply 120 mLs topically at bedtime.     fluorouracil (EFUDEX) 5 % cream Apply 40 g topically at bedtime. Apply QHS for one week, take 3 weeks off and repeat.  Avoid sun and  expect irritation.     halobetasol (ULTRAVATE) 0.05 % cream Apply topically 2 (two) times daily. 50 g 3   hydrochlorothiazide (HYDRODIURIL) 12.5 MG tablet Take by mouth.     hydrocortisone 2.5 % cream Apply 1 application topically 2 (two) times daily.     hydrocortisone 2.5 % cream Apply topically 2 (two) times daily as needed (Rash). 30 g 2   lisinopril (ZESTRIL) 5 MG tablet Take 5 mg by mouth daily.     sildenafil (VIAGRA) 100 MG tablet Viagra 100 mg tablet     simvastatin (ZOCOR) 40 MG tablet Take 40 mg by mouth daily.     tetrahydrozoline 0.05 % ophthalmic solution eye drops redness reliever     triamcinolone ointment (KENALOG) 0.1 % Apply 1 application topically 2 (two) times daily.     No current facility-administered medications for this visit.    REVIEW OF SYSTEMS:   Constitutional: ( - ) fevers, ( - )  chills , ( - ) night sweats Eyes: ( - ) blurriness of vision, ( - ) double vision, ( - ) watery eyes Ears, nose, mouth, throat, and face: ( - ) mucositis, ( - ) sore throat Respiratory: ( - ) cough, ( - ) dyspnea, ( - ) wheezes Cardiovascular: ( - ) palpitation, ( - ) chest discomfort, ( - ) lower extremity swelling Gastrointestinal:  ( - ) nausea, ( - ) heartburn, ( - ) change in bowel habits Skin: ( - ) abnormal skin rashes Lymphatics: ( - ) new lymphadenopathy, ( + ) easy bruising Neurological: ( - ) numbness, ( - ) tingling, ( - ) new weaknesses Behavioral/Psych: ( - ) mood change, ( - ) new changes  All other systems were reviewed with the patient and are negative.  PHYSICAL EXAMINATION: ECOG PERFORMANCE STATUS: 1 - Symptomatic but completely ambulatory  Vitals:   06/04/22 1355  BP: (!) 115/93  Pulse: 71  Resp: 20  Temp: 97.9 F (36.6 C)  SpO2: 98%   Filed Weights   06/04/22 1355  Weight: 190 lb 12.8 oz (86.5 kg)    GENERAL: well appearing male in NAD  SKIN: skin color, texture, turgor are normal, no rashes or significant lesions. Bruising noted on upper  extremities.  EYES: conjunctiva are pink and non-injected, sclera clear OROPHARYNX: no exudate, no erythema; lips, buccal mucosa, and tongue normal  NECK: supple, non-tender LYMPH:  no palpable lymphadenopathy in the cervical or supraclavicular lymph nodes.  LUNGS: clear to auscultation and percussion with normal breathing effort HEART: regular rate & rhythm and no murmurs and no lower extremity edema ABDOMEN: soft, non-tender, non-distended, normal bowel sounds Musculoskeletal: no cyanosis of digits and no clubbing  PSYCH: alert & oriented x 3, fluent speech NEURO: no focal motor/sensory deficits  LABORATORY DATA:  I have reviewed the data as listed    Latest Ref Rng & Units 06/04/2022    3:11 PM  CBC  WBC 4.0 - 10.5 K/uL 7.7   Hemoglobin 13.0 - 17.0 g/dL 15.6   Hematocrit 39.0 - 52.0 % 43.7   Platelets 150 - 400 K/uL 225        Latest Ref Rng & Units 06/04/2022    3:11 PM  CMP  Glucose 70 - 99 mg/dL 99   BUN 8 - 23 mg/dL 10   Creatinine 0.61 - 1.24 mg/dL 0.97   Sodium 135 - 145 mmol/L 131   Potassium 3.5 - 5.1 mmol/L 4.5   Chloride 98 - 111 mmol/L 96   CO2 22 - 32 mmol/L 26   Calcium 8.9 - 10.3 mg/dL 9.7   Total Protein 6.5 - 8.1 g/dL 8.0   Total Bilirubin 0.3 - 1.2 mg/dL 0.6   Alkaline Phos 38 - 126 U/L 62   AST 15 - 41 U/L 39   ALT 0 - 44 U/L 62     RADIOGRAPHIC STUDIES: I have personally reviewed the radiological images as listed and agreed with the findings in the report. No results found.  ASSESSMENT & PLAN [facility] is a 76 y.o. male who presents to the clinic for evaluation for thrombocytopenia.    I reviewed potential etiologies for thrombocytopenia including liver disease, splenomegaly, infectious processes, nutritional anemias, immune mediated and bone marrow disorders. Patient is not taking any medications or recent infections. Patient will proceed with labs today to check CBC, CMP, Vitamin B12, MMA, folate, LDH, Hepatitis B and C serologies, HIV  serology, platelet by citrate and save smear.   #Thrombocytopenia, etiology unknown: --Labs today to check CBC w/diff, CMP, B12 level, folate level, Hep B and C serologies, HIV serology,  Immature Platelet Fraction, PT/INR and PTT.  --Evaluate for platelet abnormality with save smear. --If above workup is negative, we will evaluate for liver disease and/or splenomegaly with abdominal US --RTC based on above workup.    Orders Placed This Encounter  Procedures   CBC with Differential ([place] Only)    Standing Status:   Future    Number of Occurrences:   1    Standing Expiration Date:   06/05/2023   CMP ([place] only)    Standing Status:   Future    Number of Occurrences:   1    Standing Expiration Date:   06/05/2023   Save Smear ([facility])    Standing Status:   Future    Number of Occurrences:   1    Standing Expiration Date:   06/05/2023   Immature Platelet Fraction    Standing Status:   Future    Number of Occurrences:   1    Standing Expiration Date:   06/05/2023   Vitamin B12    Standing Status:   Future    Number of Occurrences:   1    Standing Expiration Date:   06/04/2023   Methylmalonic acid, serum    Standing Status:   Future    Number of Occurrences:   1    Standing Expiration Date:   06/04/2023   Folate, Serum    Standing Status:   Future    Number of Occurrences:   1    Standing Expiration Date:   06/04/2023   Hepatitis B core antibody, total    Standing Status:   Future    Number of Occurrences:   1  Standing Expiration Date:   06/04/2023   Hepatitis B surface antibody    Standing Status:   Future    Number of Occurrences:   1    Standing Expiration Date:   06/04/2023   Hepatitis B surface antigen    Standing Status:   Future    Number of Occurrences:   1    Standing Expiration Date:   06/04/2023   Hepatitis C antibody    Standing Status:   Future    Number of Occurrences:   1    Standing Expiration Date:   06/04/2023   HIV antibody (with reflex)     Standing Status:   Future    Number of Occurrences:   1    Standing Expiration Date:   06/04/2023   Protime-INR    Standing Status:   Future    Number of Occurrences:   1    Standing Expiration Date:   06/05/2023   APTT    Standing Status:   Future    Number of Occurrences:   1    Standing Expiration Date:   06/04/2023    All questions were answered. The patient knows to call the clinic with any problems, questions or concerns.  I have spent a total of 60 minutes minutes of face-to-face and non-face-to-face time, preparing to see the patient, obtaining and/or reviewing separately obtained history, performing a medically appropriate examination, counseling and educating the patient, ordering tests/procedures, documenting clinical information in the electronic health record, and care coordination.   Dede Query, PA-C Department of Hematology/Oncology [place] at [facility] Phone: [phone]  Patient was seen with Dr. Lorenso Courier  I have read the above note and personally examined the patient. I agree with the assessment and plan as noted above.  Briefly Eduardo Hall is a 76 year old male who presents for evaluation of thrombocytopenia.  Due to concern for platelets of 87,000 on 05/17/2022 the patient was referred to hematology service for further evaluation and management.  Today we discussed the possible etiologies for thrombocytopenia including liver disease, ITP, pseudothrombocytopenia, or nutritional deficiency.  Today we will conduct a full work-up in order to find the etiology of his low platelet count.  The patient voiced understanding of the plan moving forward.   Ledell Peoples, MD Department of Hematology/Oncology [place] at [facility] Phone: [phone] Pager: [phone] Email: [email or web address]

## 2022-06-05 DIAGNOSIS — G4452 New daily persistent headache (NDPH): Secondary | ICD-10-CM | POA: Diagnosis not present

## 2022-06-05 DIAGNOSIS — M791 Myalgia, unspecified site: Secondary | ICD-10-CM | POA: Diagnosis not present

## 2022-06-05 DIAGNOSIS — M542 Cervicalgia: Secondary | ICD-10-CM | POA: Diagnosis not present

## 2022-06-05 DIAGNOSIS — G518 Other disorders of facial nerve: Secondary | ICD-10-CM | POA: Diagnosis not present

## 2022-06-06 DIAGNOSIS — D696 Thrombocytopenia, unspecified: Secondary | ICD-10-CM | POA: Insufficient documentation

## 2022-06-06 LAB — METHYLMALONIC ACID, SERUM: Methylmalonic Acid, Quantitative: 79 nmol/L (ref 0–378)

## 2022-06-07 ENCOUNTER — Telehealth: Payer: Self-pay | Admitting: *Deleted

## 2022-06-07 NOTE — Telephone Encounter (Signed)
Received vm message from pt asking about his lab results from 06/04/22.

## 2022-06-17 IMAGING — MR MR HEAD WO/W CM
13 of 15 series · 44 of 48 positions shown · IV contrast (multihance)
Comparison: None.

CLINICAL DATA: Frequent headaches with ringing in the left ear for
2 months

EXAM:
MRI HEAD WITHOUT AND WITH CONTRAST
TECHNIQUE: Multiplanar, multiecho pulse sequences of the brain and surrounding
structures were obtained without and with intravenous contrast.
CONTRAST:  15mL MULTIHANCE GADOBENATE DIMEGLUMINE 529 MG/ML IV SOLN

[Series 2: T1 · sagittal · 5.0mm · 0.47mm/px · 2 of 28 slices shown]
[im 1/28]
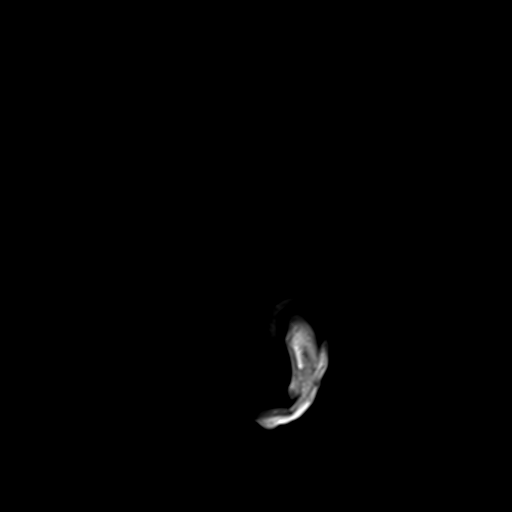
[im 28/28]
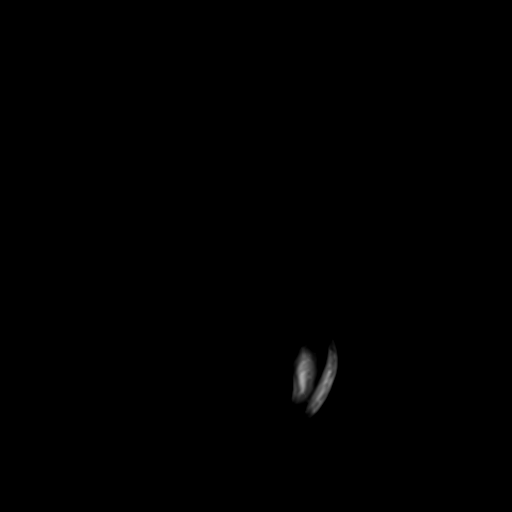

[Series 3: DWI · axial · 3.0mm · 1.88mm/px · z∈[-62,+85]mm · 5 of 100 slices shown (1 of 4)]
[im 1/100]
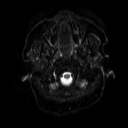
[im 25/100]
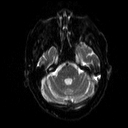
[im 50/100]
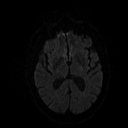
[im 75/100]
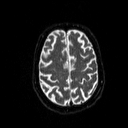
[im 100/100]
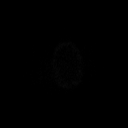

[Series 4: DWI · axial · 3.0mm · 1.88mm/px · z∈[-62,+85]mm · 3 of 49 slices shown (2 of 4)]
[im 1/49]
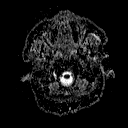
[im 25/49]
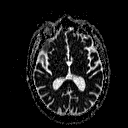
[im 49/49]
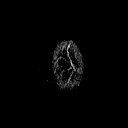

[Series 5: DWI · coronal · 5.0mm · 1.88mm/px · 5 of 90 slices shown (3 of 4)]
[im 1/90]
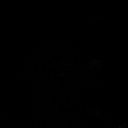
[im 23/90]
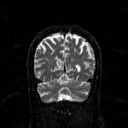
[im 45/90]
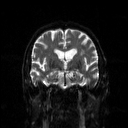
[im 67/90]
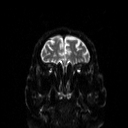
[im 90/90]
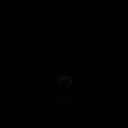

[Series 6: DWI · coronal · 5.0mm · 1.88mm/px · 2 of 44 slices shown (4 of 4)]
[im 1/44]
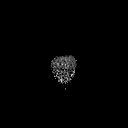
[im 44/44]
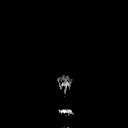

[Series 7: T2 · axial · 5.0mm · 0.62mm/px · 1 of 27 slices shown (1 of 2)]
[im 1/27]
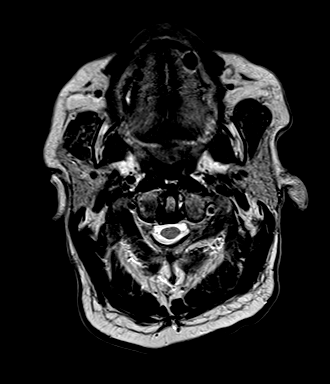

[Series 8: FLAIR · axial · 3.0mm · 0.47mm/px · z∈[-70,+93]mm · 2 of 36 slices shown (1 of 2)]
[im 1/36]
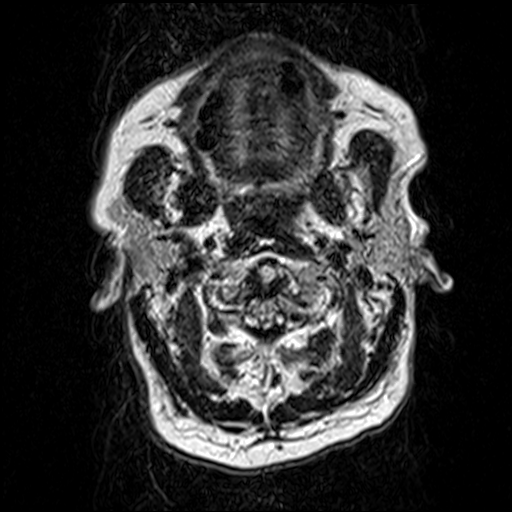
[im 36/36]
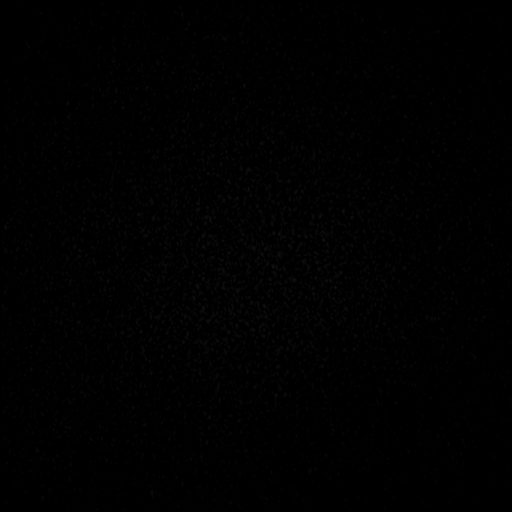

[Series 9: FLAIR · axial · 3.0mm · 0.45mm/px · z∈[-65,+98]mm · 2 of 36 slices shown (2 of 2)]
[im 1/36]
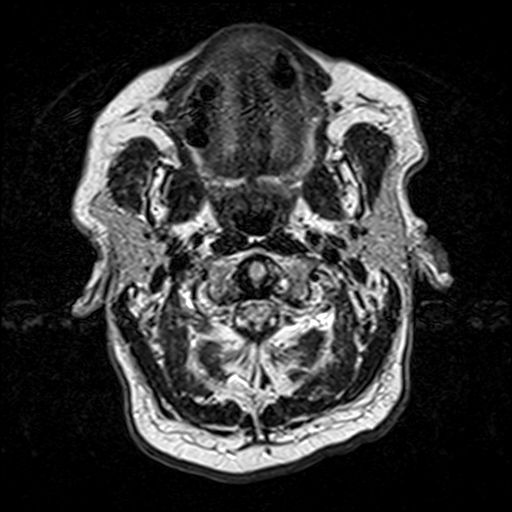
[im 36/36]
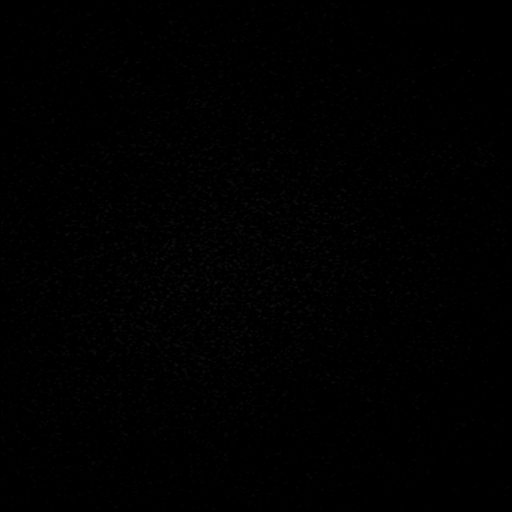

[Series 11: swi_images · axial · 4.0mm · 0.94mm/px · z∈[-74,+98]mm · 2 of 44 slices shown]
[im 1/44]
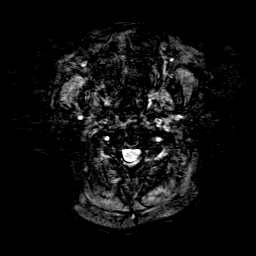
[im 44/44]
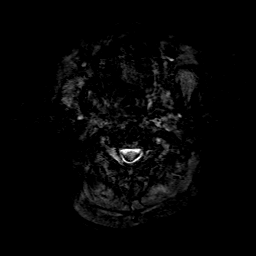

[Series 12: t1_mpr_tra · axial · 1.0mm · 0.75mm/px · z∈[-55,+88]mm · 8 of 144 slices shown]
[im 1/144]
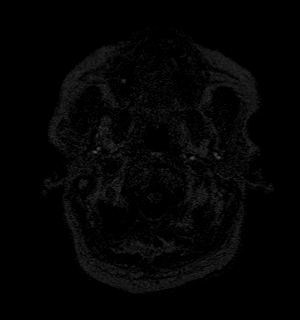
[im 21/144]
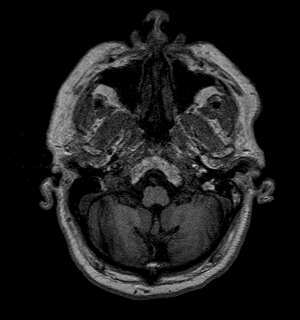
[im 41/144]
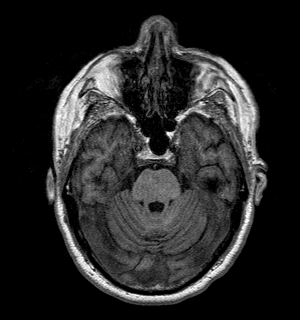
[im 62/144]
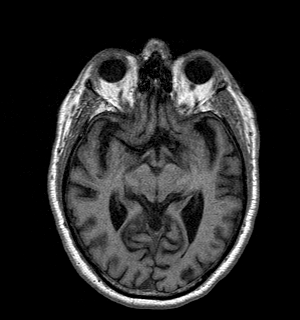
[im 82/144]
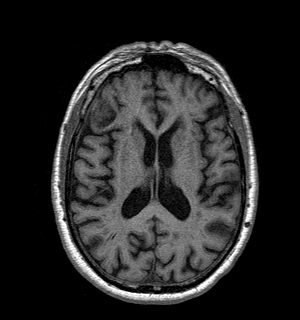
[im 103/144]
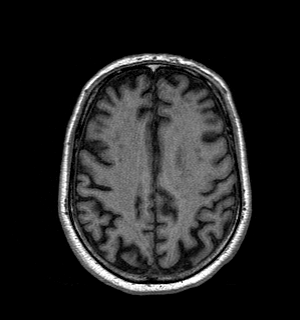
[im 123/144]
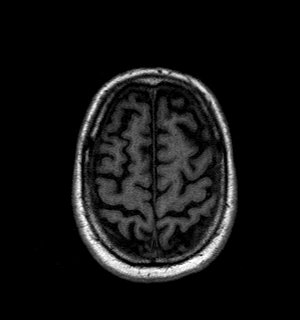
[im 144/144]
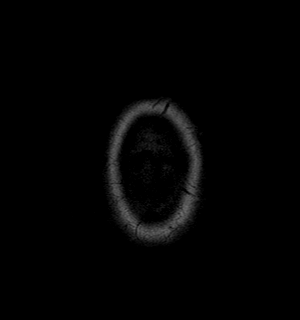

[Series 13: bSSFP · axial · 1.0mm · 0.28mm/px · z∈[-44,-1]mm · 2 of 44 slices shown]
[im 1/44]
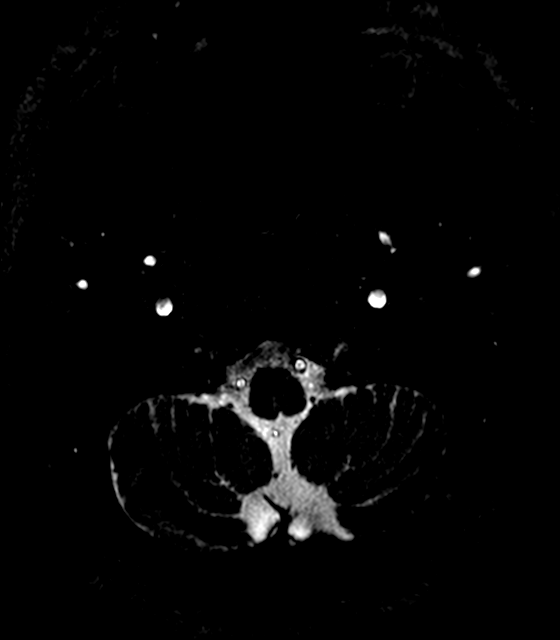
[im 44/44]
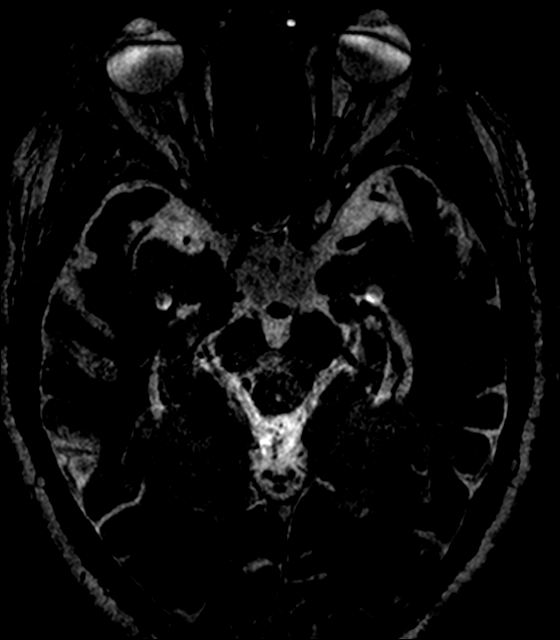

[Series 14: T2 · coronal · 5.0mm · 0.47mm/px · 2 of 35 slices shown (2 of 2)]
[im 1/35]
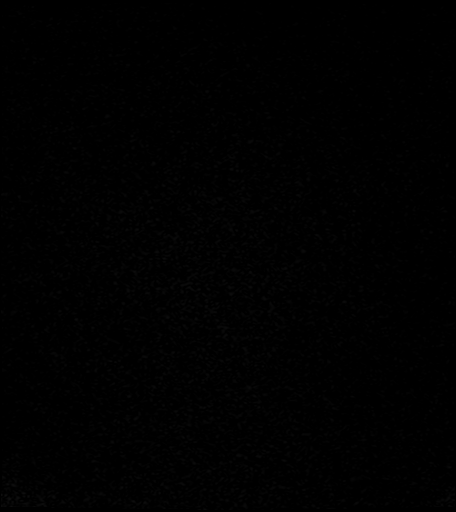
[im 35/35]
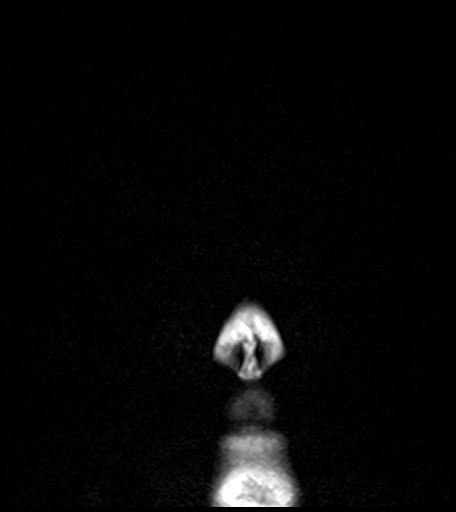

[Series 15: t1_mpr_tra post · axial · 1.0mm · 0.75mm/px · z∈[-55,+88]mm · 8 of 144 slices shown]
[im 1/144]
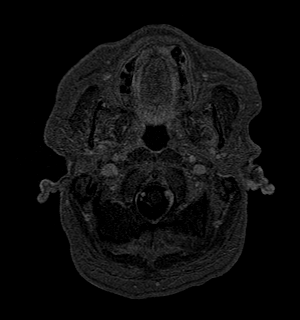
[im 21/144]
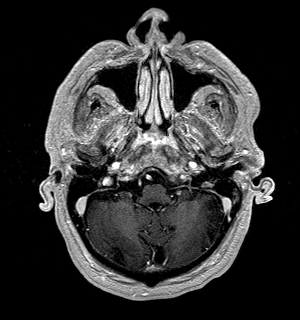
[im 41/144]
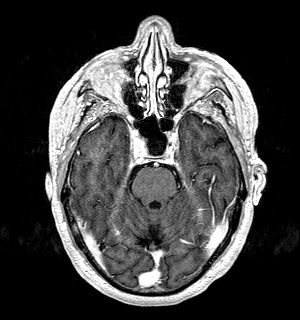
[im 62/144]
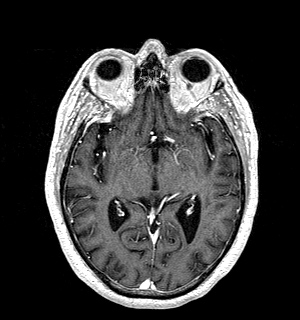
[im 82/144]
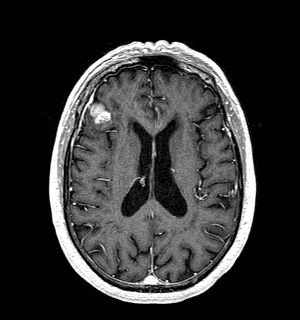
[im 103/144]
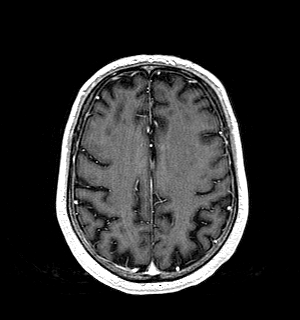
[im 123/144]
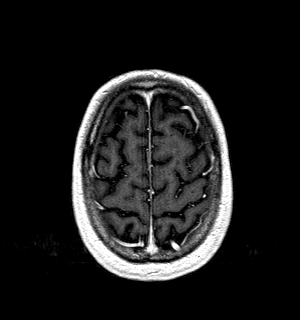
[im 144/144]
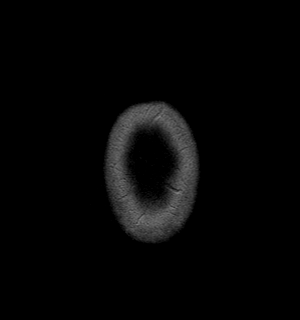

[44 of 48 positions shown; findings below may reference images not displayed]

FINDINGS: Brain: Homogeneously enhancing dural-based mass with dural tail
along the anterolateral right frontal lobe which measures 3 x
cm. There is scalloping of the adjacent cortex without brain edema.

Chronic small vessel ischemia in the cerebral white matter, overall
mild for age.Mild for age cerebral volume loss.

No abnormal enhancement or signal the left temporal bone, internal
auditory canal, cisterns, or brainstem.

No incidental infarct, hemorrhage, hydrocephalus, or collection.

Vascular: Normal flow voids and vascular enhancements

Skull and upper cervical spine: Bilateral marrow signal

Sinuses/Orbits: Bilateral cataract resection
IMPRESSION: 1. 3 x 1.8 cm right frontal dural based mass which has features of
meningioma. No edema in the adjacent brain.
2. Aging brain.
3. No evident cause for tinnitus.

## 2022-06-18 DIAGNOSIS — R42 Dizziness and giddiness: Secondary | ICD-10-CM | POA: Diagnosis not present

## 2022-07-09 DIAGNOSIS — G4452 New daily persistent headache (NDPH): Secondary | ICD-10-CM | POA: Diagnosis not present

## 2022-07-09 DIAGNOSIS — M791 Myalgia, unspecified site: Secondary | ICD-10-CM | POA: Diagnosis not present

## 2022-07-09 DIAGNOSIS — M542 Cervicalgia: Secondary | ICD-10-CM | POA: Diagnosis not present

## 2022-07-09 DIAGNOSIS — G518 Other disorders of facial nerve: Secondary | ICD-10-CM | POA: Diagnosis not present

## 2022-07-17 DIAGNOSIS — D485 Neoplasm of uncertain behavior of skin: Secondary | ICD-10-CM | POA: Diagnosis not present

## 2022-07-17 DIAGNOSIS — H9113 Presbycusis, bilateral: Secondary | ICD-10-CM | POA: Diagnosis not present

## 2022-07-17 DIAGNOSIS — R42 Dizziness and giddiness: Secondary | ICD-10-CM | POA: Diagnosis not present

## 2022-07-17 DIAGNOSIS — E7800 Pure hypercholesterolemia, unspecified: Secondary | ICD-10-CM | POA: Diagnosis not present

## 2022-07-17 DIAGNOSIS — R5383 Other fatigue: Secondary | ICD-10-CM | POA: Diagnosis not present

## 2022-07-17 DIAGNOSIS — I480 Paroxysmal atrial fibrillation: Secondary | ICD-10-CM | POA: Diagnosis not present

## 2022-07-17 DIAGNOSIS — Z1331 Encounter for screening for depression: Secondary | ICD-10-CM | POA: Diagnosis not present

## 2022-07-17 DIAGNOSIS — Z125 Encounter for screening for malignant neoplasm of prostate: Secondary | ICD-10-CM | POA: Diagnosis not present

## 2022-07-17 DIAGNOSIS — E785 Hyperlipidemia, unspecified: Secondary | ICD-10-CM | POA: Diagnosis not present

## 2022-07-17 DIAGNOSIS — R002 Palpitations: Secondary | ICD-10-CM | POA: Diagnosis not present

## 2022-07-17 DIAGNOSIS — E039 Hypothyroidism, unspecified: Secondary | ICD-10-CM | POA: Diagnosis not present

## 2022-07-17 DIAGNOSIS — I10 Essential (primary) hypertension: Secondary | ICD-10-CM | POA: Diagnosis not present

## 2022-07-17 DIAGNOSIS — I70219 Atherosclerosis of native arteries of extremities with intermittent claudication, unspecified extremity: Secondary | ICD-10-CM | POA: Diagnosis not present

## 2022-07-17 DIAGNOSIS — Z7289 Other problems related to lifestyle: Secondary | ICD-10-CM | POA: Diagnosis not present

## 2022-07-17 DIAGNOSIS — Z79899 Other long term (current) drug therapy: Secondary | ICD-10-CM | POA: Diagnosis not present

## 2022-07-17 DIAGNOSIS — Z131 Encounter for screening for diabetes mellitus: Secondary | ICD-10-CM | POA: Diagnosis not present

## 2022-07-17 DIAGNOSIS — Z1389 Encounter for screening for other disorder: Secondary | ICD-10-CM | POA: Diagnosis not present

## 2022-07-17 DIAGNOSIS — Z0000 Encounter for general adult medical examination without abnormal findings: Secondary | ICD-10-CM | POA: Diagnosis not present

## 2022-07-18 DIAGNOSIS — R1032 Left lower quadrant pain: Secondary | ICD-10-CM | POA: Diagnosis not present

## 2022-08-01 DIAGNOSIS — R42 Dizziness and giddiness: Secondary | ICD-10-CM | POA: Diagnosis not present

## 2022-08-28 DIAGNOSIS — H43813 Vitreous degeneration, bilateral: Secondary | ICD-10-CM | POA: Diagnosis not present

## 2022-08-28 DIAGNOSIS — H26493 Other secondary cataract, bilateral: Secondary | ICD-10-CM | POA: Diagnosis not present

## 2022-08-28 DIAGNOSIS — H52203 Unspecified astigmatism, bilateral: Secondary | ICD-10-CM | POA: Diagnosis not present

## 2022-08-28 DIAGNOSIS — H5213 Myopia, bilateral: Secondary | ICD-10-CM | POA: Diagnosis not present

## 2022-08-28 DIAGNOSIS — H524 Presbyopia: Secondary | ICD-10-CM | POA: Diagnosis not present

## 2022-08-28 DIAGNOSIS — H353132 Nonexudative age-related macular degeneration, bilateral, intermediate dry stage: Secondary | ICD-10-CM | POA: Diagnosis not present

## 2022-09-03 DIAGNOSIS — J209 Acute bronchitis, unspecified: Secondary | ICD-10-CM | POA: Diagnosis not present

## 2022-09-18 DIAGNOSIS — G4452 New daily persistent headache (NDPH): Secondary | ICD-10-CM | POA: Diagnosis not present

## 2022-09-18 DIAGNOSIS — M791 Myalgia, unspecified site: Secondary | ICD-10-CM | POA: Diagnosis not present

## 2022-09-18 DIAGNOSIS — G518 Other disorders of facial nerve: Secondary | ICD-10-CM | POA: Diagnosis not present

## 2022-09-18 DIAGNOSIS — M542 Cervicalgia: Secondary | ICD-10-CM | POA: Diagnosis not present

## 2022-10-08 DIAGNOSIS — L218 Other seborrheic dermatitis: Secondary | ICD-10-CM | POA: Diagnosis not present

## 2022-10-08 DIAGNOSIS — Z08 Encounter for follow-up examination after completed treatment for malignant neoplasm: Secondary | ICD-10-CM | POA: Diagnosis not present

## 2022-10-08 DIAGNOSIS — Z85828 Personal history of other malignant neoplasm of skin: Secondary | ICD-10-CM | POA: Diagnosis not present

## 2022-10-08 DIAGNOSIS — L298 Other pruritus: Secondary | ICD-10-CM | POA: Diagnosis not present

## 2022-10-16 DIAGNOSIS — R42 Dizziness and giddiness: Secondary | ICD-10-CM | POA: Diagnosis not present

## 2022-11-05 DIAGNOSIS — H26492 Other secondary cataract, left eye: Secondary | ICD-10-CM | POA: Diagnosis not present

## 2022-11-12 DIAGNOSIS — H353121 Nonexudative age-related macular degeneration, left eye, early dry stage: Secondary | ICD-10-CM | POA: Diagnosis not present

## 2022-11-12 DIAGNOSIS — H35372 Puckering of macula, left eye: Secondary | ICD-10-CM | POA: Diagnosis not present

## 2022-11-20 DIAGNOSIS — M791 Myalgia, unspecified site: Secondary | ICD-10-CM | POA: Diagnosis not present

## 2022-11-20 DIAGNOSIS — G4452 New daily persistent headache (NDPH): Secondary | ICD-10-CM | POA: Diagnosis not present

## 2022-11-20 DIAGNOSIS — G518 Other disorders of facial nerve: Secondary | ICD-10-CM | POA: Diagnosis not present

## 2022-11-20 DIAGNOSIS — M542 Cervicalgia: Secondary | ICD-10-CM | POA: Diagnosis not present

## 2022-12-24 DIAGNOSIS — H353132 Nonexudative age-related macular degeneration, bilateral, intermediate dry stage: Secondary | ICD-10-CM | POA: Diagnosis not present

## 2022-12-24 DIAGNOSIS — H35372 Puckering of macula, left eye: Secondary | ICD-10-CM | POA: Diagnosis not present

## 2023-01-15 DIAGNOSIS — D485 Neoplasm of uncertain behavior of skin: Secondary | ICD-10-CM | POA: Diagnosis not present

## 2023-01-15 DIAGNOSIS — R42 Dizziness and giddiness: Secondary | ICD-10-CM | POA: Diagnosis not present

## 2023-01-15 DIAGNOSIS — J209 Acute bronchitis, unspecified: Secondary | ICD-10-CM | POA: Diagnosis not present

## 2023-02-19 DIAGNOSIS — G518 Other disorders of facial nerve: Secondary | ICD-10-CM | POA: Diagnosis not present

## 2023-02-19 DIAGNOSIS — M791 Myalgia, unspecified site: Secondary | ICD-10-CM | POA: Diagnosis not present

## 2023-02-19 DIAGNOSIS — G4452 New daily persistent headache (NDPH): Secondary | ICD-10-CM | POA: Diagnosis not present

## 2023-02-19 DIAGNOSIS — M542 Cervicalgia: Secondary | ICD-10-CM | POA: Diagnosis not present

## 2023-03-19 DIAGNOSIS — G4452 New daily persistent headache (NDPH): Secondary | ICD-10-CM | POA: Diagnosis not present

## 2023-03-19 DIAGNOSIS — M791 Myalgia, unspecified site: Secondary | ICD-10-CM | POA: Diagnosis not present

## 2023-03-19 DIAGNOSIS — M542 Cervicalgia: Secondary | ICD-10-CM | POA: Diagnosis not present

## 2023-03-19 DIAGNOSIS — G518 Other disorders of facial nerve: Secondary | ICD-10-CM | POA: Diagnosis not present

## 2023-04-15 DIAGNOSIS — I10 Essential (primary) hypertension: Secondary | ICD-10-CM | POA: Diagnosis not present

## 2023-05-20 DIAGNOSIS — R42 Dizziness and giddiness: Secondary | ICD-10-CM | POA: Diagnosis not present

## 2023-06-24 DIAGNOSIS — H26491 Other secondary cataract, right eye: Secondary | ICD-10-CM | POA: Diagnosis not present

## 2023-06-24 DIAGNOSIS — H353123 Nonexudative age-related macular degeneration, left eye, advanced atrophic without subfoveal involvement: Secondary | ICD-10-CM | POA: Diagnosis not present

## 2023-07-09 DIAGNOSIS — H353131 Nonexudative age-related macular degeneration, bilateral, early dry stage: Secondary | ICD-10-CM | POA: Diagnosis not present

## 2023-07-09 DIAGNOSIS — Z961 Presence of intraocular lens: Secondary | ICD-10-CM | POA: Diagnosis not present

## 2023-07-09 DIAGNOSIS — H35372 Puckering of macula, left eye: Secondary | ICD-10-CM | POA: Diagnosis not present

## 2023-07-09 DIAGNOSIS — H43821 Vitreomacular adhesion, right eye: Secondary | ICD-10-CM | POA: Diagnosis not present

## 2023-07-09 DIAGNOSIS — H35453 Secondary pigmentary degeneration, bilateral: Secondary | ICD-10-CM | POA: Diagnosis not present

## 2023-07-09 DIAGNOSIS — H35363 Drusen (degenerative) of macula, bilateral: Secondary | ICD-10-CM | POA: Diagnosis not present

## 2023-07-16 DIAGNOSIS — H524 Presbyopia: Secondary | ICD-10-CM | POA: Diagnosis not present

## 2023-07-16 DIAGNOSIS — H52223 Regular astigmatism, bilateral: Secondary | ICD-10-CM | POA: Diagnosis not present

## 2023-07-21 DIAGNOSIS — I10 Essential (primary) hypertension: Secondary | ICD-10-CM | POA: Diagnosis not present

## 2023-07-23 DIAGNOSIS — D485 Neoplasm of uncertain behavior of skin: Secondary | ICD-10-CM | POA: Diagnosis not present

## 2023-07-23 DIAGNOSIS — Z0000 Encounter for general adult medical examination without abnormal findings: Secondary | ICD-10-CM | POA: Diagnosis not present

## 2023-07-23 DIAGNOSIS — Z79899 Other long term (current) drug therapy: Secondary | ICD-10-CM | POA: Diagnosis not present

## 2023-07-23 DIAGNOSIS — R1032 Left lower quadrant pain: Secondary | ICD-10-CM | POA: Diagnosis not present

## 2023-07-23 DIAGNOSIS — R5383 Other fatigue: Secondary | ICD-10-CM | POA: Diagnosis not present

## 2023-07-23 DIAGNOSIS — E785 Hyperlipidemia, unspecified: Secondary | ICD-10-CM | POA: Diagnosis not present

## 2023-07-23 DIAGNOSIS — E039 Hypothyroidism, unspecified: Secondary | ICD-10-CM | POA: Diagnosis not present

## 2023-07-23 DIAGNOSIS — E7800 Pure hypercholesterolemia, unspecified: Secondary | ICD-10-CM | POA: Diagnosis not present

## 2023-07-23 DIAGNOSIS — I70219 Atherosclerosis of native arteries of extremities with intermittent claudication, unspecified extremity: Secondary | ICD-10-CM | POA: Diagnosis not present

## 2023-07-23 DIAGNOSIS — R42 Dizziness and giddiness: Secondary | ICD-10-CM | POA: Diagnosis not present

## 2023-07-23 DIAGNOSIS — R002 Palpitations: Secondary | ICD-10-CM | POA: Diagnosis not present

## 2023-07-23 DIAGNOSIS — Z1389 Encounter for screening for other disorder: Secondary | ICD-10-CM | POA: Diagnosis not present

## 2023-07-23 DIAGNOSIS — Z131 Encounter for screening for diabetes mellitus: Secondary | ICD-10-CM | POA: Diagnosis not present

## 2023-07-23 DIAGNOSIS — Z1331 Encounter for screening for depression: Secondary | ICD-10-CM | POA: Diagnosis not present

## 2023-07-23 DIAGNOSIS — Z7289 Other problems related to lifestyle: Secondary | ICD-10-CM | POA: Diagnosis not present

## 2023-07-23 DIAGNOSIS — H9113 Presbycusis, bilateral: Secondary | ICD-10-CM | POA: Diagnosis not present

## 2023-07-23 DIAGNOSIS — M810 Age-related osteoporosis without current pathological fracture: Secondary | ICD-10-CM | POA: Diagnosis not present

## 2023-07-29 DIAGNOSIS — Z125 Encounter for screening for malignant neoplasm of prostate: Secondary | ICD-10-CM | POA: Diagnosis not present

## 2023-08-03 DIAGNOSIS — R1032 Left lower quadrant pain: Secondary | ICD-10-CM | POA: Diagnosis not present

## 2023-08-07 ENCOUNTER — Encounter (HOSPITAL_BASED_OUTPATIENT_CLINIC_OR_DEPARTMENT_OTHER): Payer: Self-pay | Admitting: Emergency Medicine

## 2023-08-07 ENCOUNTER — Emergency Department (HOSPITAL_COMMUNITY): Payer: Medicare HMO

## 2023-08-07 ENCOUNTER — Emergency Department (HOSPITAL_BASED_OUTPATIENT_CLINIC_OR_DEPARTMENT_OTHER): Payer: Medicare HMO

## 2023-08-07 ENCOUNTER — Other Ambulatory Visit: Payer: Self-pay

## 2023-08-07 ENCOUNTER — Emergency Department (HOSPITAL_BASED_OUTPATIENT_CLINIC_OR_DEPARTMENT_OTHER)
Admission: EM | Admit: 2023-08-07 | Discharge: 2023-08-08 | Disposition: A | Discharge status: Home / Self Care | Payer: Medicare HMO | Attending: Emergency Medicine | Admitting: Emergency Medicine

## 2023-08-07 DIAGNOSIS — R220 Localized swelling, mass and lump, head: Secondary | ICD-10-CM | POA: Diagnosis not present

## 2023-08-07 DIAGNOSIS — Z79899 Other long term (current) drug therapy: Secondary | ICD-10-CM | POA: Insufficient documentation

## 2023-08-07 DIAGNOSIS — D329 Benign neoplasm of meninges, unspecified: Secondary | ICD-10-CM

## 2023-08-07 DIAGNOSIS — I10 Essential (primary) hypertension: Secondary | ICD-10-CM | POA: Diagnosis not present

## 2023-08-07 DIAGNOSIS — R202 Paresthesia of skin: Secondary | ICD-10-CM

## 2023-08-07 DIAGNOSIS — I6782 Cerebral ischemia: Secondary | ICD-10-CM | POA: Diagnosis not present

## 2023-08-07 DIAGNOSIS — R1032 Left lower quadrant pain: Secondary | ICD-10-CM | POA: Diagnosis not present

## 2023-08-07 DIAGNOSIS — G459 Transient cerebral ischemic attack, unspecified: Secondary | ICD-10-CM | POA: Diagnosis not present

## 2023-08-07 DIAGNOSIS — R9089 Other abnormal findings on diagnostic imaging of central nervous system: Secondary | ICD-10-CM | POA: Diagnosis not present

## 2023-08-07 HISTORY — DX: Depression, unspecified: F32.A

## 2023-08-07 HISTORY — DX: Anxiety disorder, unspecified: F41.9

## 2023-08-07 LAB — BASIC METABOLIC PANEL
Anion gap: 7 (ref 5–15)
BUN: 9 mg/dL (ref 8–23)
CO2: 29 mmol/L (ref 22–32)
Calcium: 8.7 mg/dL — ABNORMAL LOW (ref 8.9–10.3)
Chloride: 96 mmol/L — ABNORMAL LOW (ref 98–111)
Creatinine, Ser: 0.74 mg/dL (ref 0.61–1.24)
GFR, Estimated: >60 mL/min (ref >60)
Glucose, Bld: 96 mg/dL (ref 70–99)
Potassium: 4.1 mmol/L (ref 3.5–5.1)
Sodium: 132 mmol/L — ABNORMAL LOW (ref 135–145)

## 2023-08-07 LAB — LIPID PANEL
Cholesterol: 113 mg/dL (ref 0–200)
HDL: 46 mg/dL (ref >40)
LDL Cholesterol: 38 mg/dL (ref 0–99)
Total CHOL/HDL Ratio: 2.5 RATIO
Triglycerides: 144 mg/dL (ref <150)
VLDL: 29 mg/dL (ref 0–40)

## 2023-08-07 LAB — CBC
HCT: 37.1 % — ABNORMAL LOW (ref 39.0–52.0)
Hemoglobin: 12.9 g/dL — ABNORMAL LOW (ref 13.0–17.0)
MCH: 31.1 pg (ref 26.0–34.0)
MCHC: 34.8 g/dL (ref 30.0–36.0)
MCV: 89.4 fL (ref 80.0–100.0)
Platelets: 175 10*3/uL (ref 150–400)
RBC: 4.15 MIL/uL — ABNORMAL LOW (ref 4.22–5.81)
RDW: 12.8 % (ref 11.5–15.5)
WBC: 5.7 10*3/uL (ref 4.0–10.5)
nRBC: 0 % (ref 0.0–0.2)

## 2023-08-07 LAB — MAGNESIUM: Magnesium: 1.8 mg/dL (ref 1.7–2.4)

## 2023-08-07 MED ORDER — GADOBUTROL 1 MMOL/ML IV SOLN
8.0000 mL | Freq: Once | INTRAVENOUS | Status: AC | PRN
  Administered 2023-08-07: 8 mL via INTRAVENOUS

## 2023-08-07 NOTE — Discharge Instructions (Addendum)
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  This has been seen on prior imaging as well.  Follow-up with your neurologist regarding these findings.

## 2023-08-07 NOTE — ED Triage Notes (Signed)
Pt c/o numbness and tingling in R hand/fingers that was noticed around 0300 this am. No blood thinners. On exam, pt feels more numbness on R arm. Hx of HTN.

## 2023-08-07 NOTE — ED Notes (Signed)
Patient has been assisted to the restroom at this time.

## 2023-08-07 NOTE — ED Notes (Signed)
Patient transported to CT 

## 2023-08-07 NOTE — ED Notes (Signed)
[name] secured for POV transfer to [facility]. Pt instructed to go directly to [facility]. for further evaluation and MRI. Pt and visitor verbalized understanding. Dr. Renaye Rakers is the transferring provider and Dr. Maple Hudson at [facility] is the accepting provider. Pt ambulatory, CA&Ox4, and in NAD at time of D/C / Tx.

## 2023-08-07 NOTE — ED Provider Notes (Signed)
[place] EMERGENCY DEPARTMENT AT [facility] Provider Note   CSN: [id number] Arrival date & time: 08/07/23  1520     History  Chief Complaint  Patient presents with   Numbness    Eduardo Hall is a 77 y.o. male with reported history of TIA, hypertension, hyperlipidemia, presented to the ED with tingling in his right fingers.  Patient reports he developed numbness and paresthesia sensation in his right hand and specifically fingertips sometime early this morning around 3 AM.  Is been persistent all day.  He denies any facial numbness, difficulty speaking, balance problems, weakness in his legs.  He denies that he has had the symptoms in the past.  He reports he is a chronic persistent headache for "many years" but no new changes.  Denies vision changes.  He is here with his wife at the bedside.  He is not on anticoagulation or antiplatelet medications.  He takes ibuprofen regularly for headaches, as well as lisinopril and amlodipine for high blood pressure and a statin for high cholesterol.  He says he quit smoking many years ago.  HPI     Home Medications Prior to Admission medications   Medication Sig Start Date End Date Taking? Authorizing Provider  ALPRAZolam Prudy Feeler) 1 MG tablet Take 1 mg by mouth at bedtime as needed. 03/30/20   [provider]  amLODipine (NORVASC) 10 MG tablet Take 10 mg by mouth daily.    [provider]  ciclopirox (LOPROX) 0.77 % cream Apply topically 2 (two) times daily.    [provider]  Ciclopirox 1 % shampoo Apply 120 mLs topically at bedtime.    [provider]  fluorouracil (EFUDEX) 5 % cream Apply 40 g topically at bedtime. Apply QHS for one week, take 3 weeks off and repeat.  Avoid sun and expect irritation.    Clark-Burning, Victorino Dike, PA-C  halobetasol (ULTRAVATE) 0.05 % cream Apply topically 2 (two) times daily. 06/26/20   Janalyn Harder, MD  hydrochlorothiazide (HYDRODIURIL) 12.5 MG tablet Take by mouth.     [provider]  hydrocortisone 2.5 % cream Apply 1 application topically 2 (two) times daily. 10/02/18   [provider]  hydrocortisone 2.5 % cream Apply topically 2 (two) times daily as needed (Rash). 07/10/20   Clark-Burning, Victorino Dike, PA-C  lisinopril (ZESTRIL) 5 MG tablet Take 5 mg by mouth daily.    [provider]  sildenafil (VIAGRA) 100 MG tablet Viagra 100 mg tablet    [provider]  simvastatin (ZOCOR) 40 MG tablet Take 40 mg by mouth daily.    [provider]  tetrahydrozoline 0.05 % ophthalmic solution eye drops redness reliever    [provider]  triamcinolone ointment (KENALOG) 0.1 % Apply 1 application topically 2 (two) times daily.    [provider]      Allergies    Ampicillin    Review of Systems   Review of Systems  Physical Exam Updated Vital Signs BP (!) 159/80 (BP Location: Left Arm)   Pulse 71   Temp 98 F (36.7 C) (Oral)   Resp 18   Ht 5\' 10"  (1.778 m)   Wt 79.4 kg   SpO2 95%   BMI 25.11 kg/m  Physical Exam Constitutional:      General: He is not in acute distress. HENT:     Head: Normocephalic and atraumatic.  Eyes:     Conjunctiva/sclera: Conjunctivae normal.     Pupils: Pupils are equal, round, and reactive to light.  Cardiovascular:     Rate and Rhythm: Normal rate and regular rhythm.  Pulmonary:     Effort: Pulmonary effort is normal. No respiratory distress.  Abdominal:     General: There is no distension.     Tenderness: There is no abdominal tenderness.  Skin:    General: Skin is warm and dry.  Neurological:     Mental Status: He is alert and oriented to person, place, and time.     Motor: No weakness.     Coordination: Coordination normal.     Comments: Patient reporting paresthesias involving all the fingertips of the right hand and the dorsum and palmar surface of the right hand, potentially the right forearm, when compared to sensation on the left  side. 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        Behavior: Behavior normal.     ED Results / Procedures / Treatments   Labs (all labs ordered are listed, but only abnormal results are displayed) Labs Reviewed  BASIC METABOLIC PANEL - Abnormal; Notable for the following components:      Result Value   Sodium 132 (*)    Chloride 96 (*)    Calcium 8.7 (*)    All other components within normal limits  CBC - Abnormal; Notable for the following components:   RBC 4.15 (*)    Hemoglobin 12.9 (*)    HCT 37.1 (*)    All other components within normal limits  MAGNESIUM  LIPID PANEL    EKG EKG Interpretation Date/Time:  Thursday August 07 2023 15:29:53 EDT Ventricular Rate:  73 PR Interval:  166 QRS Duration:  92 QT Interval:  374 QTC Calculation: 412 R Axis:   82  Text Interpretation: Normal sinus rhythm Low voltage QRS Cannot rule out Anterior infarct , age undetermined No previous ECGs available Confirmed by Alvester Chou [phone]) on 08/07/2023 3:44:46 PM  Radiology CT Head Wo Contrast  Result Date: 08/07/2023 CLINICAL DATA:  Provided history: Transient ischemic attack (TIA). Tingling and numbness in right hand. EXAM: CT HEAD WITHOUT CONTRAST TECHNIQUE: Contiguous axial images were obtained from the base of the skull through the vertex without intravenous contrast. RADIATION DOSE REDUCTION: This exam was performed according to the departmental dose-optimization program which includes automated exposure control, adjustment of the mA and/or kV according to patient size and/or use of iterative reconstruction technique. COMPARISON:  Brain MRI 03/03/2021.  Head CT 09/18/2020. FINDINGS: Brain: Mild generalized parenchymal atrophy. Partially calcified extra-axial  mass along the anterolateral right frontal lobe, measuring 3.0 x 2.4 cm in transaxial dimensions. The mass increased in size from the prior brain MRI of 03/03/2021, previously measuring 3.0 x 1.8 cm. Correlating with the prior MRI, the imaging features most consistent with a meningioma. Indentation of the underlying brain parenchyma without appreciable underlying parenchymal edema. Patchy and ill-defined hypoattenuation within the cerebral white matter, nonspecific but compatible with mild chronic small vessel ischemic disease. No demarcated cortical infarct. No extra-axial fluid collection. No midline shift. Vascular: No hyperdense vessel.  Atherosclerotic calcifications. Skull: No calvarial fracture or aggressive osseous lesion. Sinuses/Orbits: No mass or acute finding within the imaged orbits. No significant paranasal sinus disease at the imaged levels. IMPRESSION: 1. No evidence of acute intracranial hemorrhage or an acute infarct. 2.  3.0 x 2.4 cm extra-axial mass (most consistent with a meningioma) along the anterolateral right frontal lobe, increased in size from the prior brain MRI of 03/03/2021 (previously 3.0 x 1.8 cm. Indentation of the underlying brain parenchyma without appreciable underlying parenchymal edema. No ventricular effacement or midline shift. 3. Mild chronic small vessel ischemic changes within the cerebral white matter. 4. Generalized parenchymal atrophy. Electronically Signed   By: Jackey Loge D.O.   On: 08/07/2023 17:03    Procedures Procedures    Medications Ordered in ED Medications - No data to display  ED Course/ Medical Decision Making/ A&P Clinical Course as of 08/07/23 1827  Thu Aug 07, 2023  1813 Dr Selina Cooley neurology recommending MRI brain with and without contrast to evaluate for TIA/stroke.  Meningioma is known from prior imaging, slightly larger, unlikely source of acute symptoms (given laterality of lesion).  If no acute findings on MRI, patient could be discharged  home.  Patient and wife in agreement, wife will [place] [facility] [place] at this time. [MT]    Clinical Course User Index [MT] Meshia Rau, Kermit Balo, MD                                 Medical Decision Making Amount and/or Complexity of Data Reviewed Labs: ordered. Radiology: ordered.   Patient is presenting with paresthesias in his right hand and fingers, onset approximately 12 to 14 hours ago.  He is NIH stroke scale of 1 with this reported paresthesia.  He is outside the expected window for thrombolytic intervention, and has no evidence of large vessel occlusion.  Patient is pending CT imaging of the head at this time for TIA versus stroke evaluation.  Will also check electrolytes for electrolyte derangement.  This pattern of paresthesia does not fit a cervical lesion dermatome distribution.  I personally reviewed and interpreted the patient's labs, notable for no emergent findings  CT head does show incidental enlargement of patient's known meningioma.  This was discussed with the patient and his wife.  I discussed the case with the neurologist by phone who did recommend transfer to [facility] for an MRI of the brain with and without contrast to evaluate for stroke or TIA.  Meningioma is unlikely cause of the patient's symptoms.  Patient's wife will be taking him POV directly to the [facility] emergency room.  Dr Estanislado Pandy EDP accepting  At the time of transfer patient remains with an unchanged neurological exam, and is otherwise stable.        Final Clinical Impression(s) / ED Diagnoses Final diagnoses:  Right hand paresthesia  Meningioma [facility])    Rx / DC Orders ED Discharge Orders     None         Liela Rylee, Kermit Balo, MD 08/07/23 1827

## 2023-08-07 NOTE — ED Notes (Signed)
Patient was able to give a urine sample. Patient is back in the bed and placed on cardiac monitor.

## 2023-08-08 NOTE — ED Provider Notes (Signed)
Patient presents for MRI with and without contrast of the brain.  MRI does not show any significant acute findings including stroke.  He does have the known meningioma.  On my reevaluation he is awake and alert.  He states he feels well and is ready to go home.  I discussed the findings with him.  He does see a neurologist regularly and I recommend follow-up with him.  Physical Exam  BP (!) 159/63   Pulse 75   Temp 98.5 F (36.9 C) (Oral)   Resp 18   Ht 1.778 m (5\' 10" )   Wt 79.4 kg   SpO2 97%   BMI 25.11 kg/m   Physical Exam , Alert, no acute distress Procedures  Procedures  ED Course / MDM   Clinical Course as of 08/08/23 0017  Thu Aug 07, 2023  1813 Dr Selina Cooley neurology recommending MRI brain with and without contrast to evaluate for TIA/stroke.  Meningioma is known from prior imaging, slightly larger, unlikely source of acute symptoms (given laterality of lesion).  If no acute findings on MRI, patient could be discharged home.  Patient and wife in agreement, wife will [place] [facility] P[place] [facility] at this time. [MT]    Clinical Course User Index [MT] Trifan, Kermit Balo, MD   Medical Decision Making Amount and/or Complexity of Data Reviewed Labs: ordered. Radiology: ordered.  Risk Prescription drug management.   Problem List Items Addressed This Visit   None Visit Diagnoses     Right hand paresthesia    -  Primary   Meningioma [facility])                 Courteney Alderete, Mayer Masker, MD 08/08/23 [phone]

## 2023-08-14 DIAGNOSIS — R42 Dizziness and giddiness: Secondary | ICD-10-CM | POA: Diagnosis not present

## 2023-08-15 DIAGNOSIS — R42 Dizziness and giddiness: Secondary | ICD-10-CM | POA: Diagnosis not present

## 2023-08-25 DIAGNOSIS — G4452 New daily persistent headache (NDPH): Secondary | ICD-10-CM | POA: Diagnosis not present

## 2023-08-26 DIAGNOSIS — R42 Dizziness and giddiness: Secondary | ICD-10-CM | POA: Diagnosis not present

## 2023-08-29 DIAGNOSIS — R42 Dizziness and giddiness: Secondary | ICD-10-CM | POA: Diagnosis not present

## 2023-09-11 DIAGNOSIS — L570 Actinic keratosis: Secondary | ICD-10-CM | POA: Diagnosis not present

## 2023-09-11 DIAGNOSIS — L218 Other seborrheic dermatitis: Secondary | ICD-10-CM | POA: Diagnosis not present

## 2023-10-13 DIAGNOSIS — R42 Dizziness and giddiness: Secondary | ICD-10-CM | POA: Diagnosis not present

## 2023-10-22 DIAGNOSIS — R42 Dizziness and giddiness: Secondary | ICD-10-CM | POA: Diagnosis not present

## 2023-10-24 ENCOUNTER — Ambulatory Visit: Payer: Medicare HMO | Admitting: Family Medicine

## 2023-10-28 DIAGNOSIS — R42 Dizziness and giddiness: Secondary | ICD-10-CM | POA: Diagnosis not present

## 2023-11-11 DIAGNOSIS — L309 Dermatitis, unspecified: Secondary | ICD-10-CM | POA: Diagnosis not present

## 2023-11-11 DIAGNOSIS — L578 Other skin changes due to chronic exposure to nonionizing radiation: Secondary | ICD-10-CM | POA: Diagnosis not present
